# Patient Record
Sex: Female | Born: 1980 | Hispanic: No | Marital: Married | Smoking: Never smoker
Health system: Southern US, Community
[De-identification: ages and names within clinical notes are randomized; demographics above are authoritative.]

## PROBLEM LIST (undated history)

## (undated) DIAGNOSIS — N979 Female infertility, unspecified: Secondary | ICD-10-CM

## (undated) DIAGNOSIS — O24419 Gestational diabetes mellitus in pregnancy, unspecified control: Secondary | ICD-10-CM

## (undated) DIAGNOSIS — B019 Varicella without complication: Secondary | ICD-10-CM

## (undated) DIAGNOSIS — E079 Disorder of thyroid, unspecified: Secondary | ICD-10-CM

## (undated) DIAGNOSIS — Z789 Other specified health status: Secondary | ICD-10-CM

## (undated) DIAGNOSIS — K219 Gastro-esophageal reflux disease without esophagitis: Secondary | ICD-10-CM

## (undated) DIAGNOSIS — J329 Chronic sinusitis, unspecified: Secondary | ICD-10-CM

## (undated) HISTORY — DX: Gestational diabetes mellitus in pregnancy, unspecified control: O24.419

## (undated) HISTORY — DX: Gastro-esophageal reflux disease without esophagitis: K21.9

## (undated) HISTORY — PX: NO PAST SURGERIES: SHX2092

## (undated) HISTORY — DX: Chronic sinusitis, unspecified: J32.9

## (undated) HISTORY — PX: WISDOM TOOTH EXTRACTION: SHX21

## (undated) HISTORY — DX: Female infertility, unspecified: N97.9

## (undated) HISTORY — DX: Other specified health status: Z78.9

## (undated) HISTORY — DX: Varicella without complication: B01.9

## (undated) HISTORY — DX: Disorder of thyroid, unspecified: E07.9

---

## 2017-11-02 DIAGNOSIS — R51 Headache: Secondary | ICD-10-CM | POA: Diagnosis not present

## 2017-11-17 ENCOUNTER — Ambulatory Visit: Payer: BLUE CROSS/BLUE SHIELD | Admitting: Family Medicine

## 2017-11-20 ENCOUNTER — Encounter: Payer: Self-pay | Admitting: Family Medicine

## 2017-11-20 ENCOUNTER — Ambulatory Visit (INDEPENDENT_AMBULATORY_CARE_PROVIDER_SITE_OTHER): Payer: BLUE CROSS/BLUE SHIELD | Admitting: Family Medicine

## 2017-11-20 VITALS — BP 111/74 | HR 68 | Temp 98.0°F | Resp 20 | Ht 62.5 in | Wt 147.0 lb

## 2017-11-20 DIAGNOSIS — R7989 Other specified abnormal findings of blood chemistry: Secondary | ICD-10-CM | POA: Insufficient documentation

## 2017-11-20 DIAGNOSIS — Z79899 Other long term (current) drug therapy: Secondary | ICD-10-CM | POA: Insufficient documentation

## 2017-11-20 DIAGNOSIS — Z789 Other specified health status: Secondary | ICD-10-CM

## 2017-11-20 DIAGNOSIS — K2100 Gastro-esophageal reflux disease with esophagitis, without bleeding: Secondary | ICD-10-CM

## 2017-11-20 DIAGNOSIS — Z7689 Persons encountering health services in other specified circumstances: Secondary | ICD-10-CM

## 2017-11-20 DIAGNOSIS — L659 Nonscarring hair loss, unspecified: Secondary | ICD-10-CM | POA: Diagnosis not present

## 2017-11-20 MED ORDER — PANTOPRAZOLE SODIUM 40 MG PO TBEC: 40.0000 mg | DELAYED_RELEASE_TABLET | Freq: Every day | ORAL | 3 refills | Status: DC

## 2017-11-20 NOTE — Progress Notes (Signed)
Patient ID: Sonya Clayton, female  DOB: 05-Sep-1980, 37 y.o.   MRN: [id number] Patient Care Team    Relationship Specialty Notifications Start End  Natalia LeatherwoodKuneff, Divinity Kyler A, DO PCP - General Family Medicine  11/20/17     Chief Complaint  Patient presents with  . Establish Care  . Hypothyroidism    Subjective:  Sonya Clayton is a 37 y.o.  female present for new patient establishment. All past medical history, surgical history, allergies, family history, immunizations, medications and social history were updated in the electronic medical record today. All recent labs, ED visits and hospitalizations within the last year were reviewed.  Patient [place] to [place].  Abnormal TSH/hair loss/vegetarian diet Patient reports her thyroid has been supplemented off and on throughout the last few years of her life and pregnancies.  She has not been on supplements for a few years.  They have been monitoring her thyroid levels approximately every 6 months and her last level was borderline.  She states that was approximately 7 months ago.  Patient does complain of weight gain, dry skin and hair loss today.  Use of proton pump inhibitor therapy/Gastroesophageal reflux disease with esophagitis She reports longtime use of pantoprazole.  She states she has had an EGD in 2015 and in 2018.  She has been unable to discontinue use of PPI without reproduction of symptoms.  She states with the medication her symptoms are controlled with only occasional flares if she eats something spicy or oily.   Depression screen PHQ 2/9 11/20/2017  Decreased Interest 0  Down, Depressed, Hopeless 0  PHQ - 2 Score 0   No flowsheet data found.  Current Exercise Habits: The patient does not participate in regular exercise at present Exercise limited by: None identified Fall Risk  11/20/2017  Falls in the past year? No    There is no immunization history on file for this patient.  No exam  data present  Past Medical History:  Diagnosis Date  . Chicken pox   . GERD (gastroesophageal reflux disease)   . Gestational diabetes   . Thyroid disease   . Vegetarian diet    Allergies  Allergen Reactions  . Penicillins Hives   Past Surgical History:  Procedure Laterality Date  . NO PAST SURGERIES     Family History  Problem Relation Age of Onset  . Hypertension Mother   . Hypertension Father   . Breast cancer Paternal Grandmother   . Diabetes Paternal Grandmother   . Hypertension Paternal Grandmother   . Hypertension Paternal Grandfather    Social History   Socioeconomic History  . Marital status: Married    Spouse name: Not on file  . Number of children: Not on file  . Years of education: Not on file  . Highest education level: Not on file  Occupational History  . Not on file  Social Needs  . Financial resource strain: Not on file  . Food insecurity:    Worry: Not on file    Inability: Not on file  . Transportation needs:    Medical: Not on file    Non-medical: Not on file  Tobacco Use  . Smoking status: Never Smoker  . Smokeless tobacco: Never Used  Substance and Sexual Activity  . Alcohol use: Never    Frequency: Never  . Drug use: Never  . Sexual activity: Yes    Partners: Male  Lifestyle  . Physical activity:    Days  per week: Not on file    Minutes per session: Not on file  . Stress: Not on file  Relationships  . Social connections:    Talks on phone: Not on file    Gets together: Not on file    Attends religious service: Not on file    Active member of club or organization: Not on file    Attends meetings of clubs or organizations: Not on file    Relationship status: Not on file  . Intimate partner violence:    Fear of current or ex partner: Not on file    Emotionally abused: Not on file    Physically abused: Not on file    Forced sexual activity: Not on file  Other Topics Concern  . Not on file  Social History Narrative   Marital  status/children/pets: married, 2 children   Education/employment: B.S. Electrical eng.    Safety:      -Wears a bicycle helmet riding a bike: No     -smoke alarm in the home:Yes     - wears seatbelt: Yes     - Feels safe in their relationships: Yes   Allergies as of 11/20/2017      Reactions   Penicillins Hives      Medication List        Accurate as of 11/20/17  6:29 PM. Always use your most recent med list.          multivitamin capsule Take 1 capsule by mouth daily.   pantoprazole 40 MG tablet Commonly known as:  PROTONIX Take 1 tablet (40 mg total) by mouth daily.       All past medical history, surgical history, allergies, family history, immunizations andmedications were updated in the EMR today and reviewed under the history and medication portions of their EMR.     No results found for this or any previous visit (from the past 2160 hour(s)).  Patient was never admitted.   ROS: 14 pt review of systems performed and negative (unless mentioned in an HPI)  Objective: BP 111/74 (BP Location: Left Arm, Patient Position: Sitting, Cuff Size: Normal)   Pulse 68   Temp 98 F (36.7 C)   Resp 20   Ht 5' 2.5" (1.588 m)   Wt 147 lb (66.7 kg)   SpO2 97%   BMI 26.46 kg/m  Gen: Afebrile. No acute distress. Nontoxic in appearance, well-developed, well-nourished, very pleasant, overweight female from [place]. HENT: AT. [place].  MMM.  Scalp with diffuse thinning of hair, no patchy hair loss. Eyes:Pupils Equal Round Reactive to light, Extraocular movements intact,  Conjunctiva without redness, discharge or icterus. Neck/lymp/endocrine: Supple, no lymphadenopathy, no thyromegaly CV: RRR no murmur, no edema Chest: CTAB, no wheeze, rhonchi or crackles.  Abd: Soft.NTND. BS present.  Skin: No rashes, purpura or petechiae. Warm and well-perfused. Skin intact. Neuro/Msk:  Normal gait. PERLA. EOMi. Alert. Oriented x3.   Psych: Normal affect, dress and demeanor. Normal speech. Normal  thought content and judgment.  Assessment/plan: Zayley Arras is a 37 y.o. female present for new pt establishment.  Abnormal TSH - retest today. Can not complete more than TSH with her on high dose biotin.  Advised her to stop the biotin since it is not working for her, and we may need to further evaluate her thyroid in the future. - TSH  Hair loss/Vegetarian diet/Long term use of PPI -Patient with overall thinning hair of scalp.  Discussed options with her today on evaluation and agreed to  move forward with vitamin deficiency testing given she has a vegetarian diet and on long-term PPI. - Vitamin D (25 hydroxy) - B12 - Iron, TIBC and Ferritin Panel - CBC - TSH - If labs do not indicate cause, will refer to dermatology for further eval.   Use of proton pump inhibitor therapy/Gastroesophageal reflux disease with esophagitis -She has been on long-term use of Protonix with reports of 2 EGDs.  He has been unable to stop medication without reproduction of symptoms. -Refills provided for her today. - Magnesium - pantoprazole (PROTONIX) 40 MG tablet; Take 1 tablet (40 mg total) by mouth daily.  Dispense: 90 tablet; Refill: 3   Return if symptoms worsen or fail to improve.   Note is dictated utilizing voice recognition software. Although note has been proof read prior to signing, occasional typographical errors still can be missed. If any questions arise, please do not hesitate to call for verification.  Electronically signed by: Felix Pacini, DO [facility] [place]

## 2017-11-20 NOTE — Patient Instructions (Addendum)
We will test your blood today for vit d, b12, iron and thyroid.  You can stop the biotin if you want. If we need to run the whole panel we will need you to  not be on this medication.  I have sent refills of your Pantoprazole as well.    Please help us help you:  We are honored you have [facility]. Below you will find basic instructions that you may need to access in the future. Please help us help you by reading the instructions, which cover many of the frequent questions we experience.   Prescription refills and request:  -In order to allow more efficient response time, please call your pharmacy for all refills. They will forward the request electronically to us. This allows for the quickest possible response. Request left on a nurse line can take longer to refill, since these are checked as time allows between office patients and other phone calls.  - refill request can take up to 3-5 working days to complete.  - If request is sent electronically and request is appropiate, it is usually completed in 1-2 business days.  - all patients will need to be seen routinely for all chronic medical conditions requiring prescription medications (see follow-up below). If you are overdue for follow up on your condition, you will be asked to make an appointment and we will call in enough medication to cover you until your appointment (up to 30 days).  - all controlled substances will require a face to face visit to request/refill.  - if you desire your prescriptions to go through a new pharmacy, and have an active script at original pharmacy, you will need to call your pharmacy and have scripts transferred to new pharmacy. This is completed between the pharmacy locations and not by your provider.    Results: If any images or labs were ordered, it can take up to 1 week to get results depending on the test ordered and the lab/facility running and resulting the test. -  Normal or stable results, which do not need further discussion, may be released to your mychart immediately with attached note to you. A call may not be generated for normal results. Please make certain to sign up for mychart. If you have questions on how to activate your mychart you can call the front office.  - If your results need further discussion, our office will attempt to contact you via phone, and if unable to reach you after 2 attempts, we will release your abnormal result to your mychart with instructions.  - All results will be automatically released in mychart after 1 week.  - Your provider will provide you with explanation and instruction on all relevant material in your results. Please keep in mind, results and labs may appear confusing or abnormal to the untrained eye, but it does not mean they are actually abnormal for you personally. If you have any questions about your results that are not covered, or you desire more detailed explanation than what was provided, you should make an appointment with your provider to do so.   Our office handles many outgoing and incoming calls daily. If we have not contacted you within 1 week about your results, please check your mychart to see if there is a message first and if not, then contact our office.  In helping with this matter, you help decrease call volume, and therefore allow us to be able to respond  to patients needs more efficiently.   Acute office visits (sick visit):  An acute visit is intended for a new problem and are scheduled in shorter time slots to allow schedule openings for patients with new problems. This is the appropriate visit to discuss a new problem. Problems will not be addressed by phone call or Echart message. Appointment is needed if requesting treatment. In order to provide you with excellent quality medical care with proper time for you to explain your problem, have an exam and receive treatment with instructions, these  appointments should be limited to one new problem per visit. If you experience a new problem, in which you desire to be addressed, please make an acute office visit, we save openings on the schedule to accommodate you. Please do not save your new problem for any other type of visit, let us take care of it properly and quickly for you.   Follow up visits:  Depending on your condition(s) your provider will need to see you routinely in order to provide you with quality care and prescribe medication(s). Most chronic conditions (Example: hypertension, Diabetes, depression/anxiety... etc), require visits a couple times a year. Your provider will instruct you on proper follow up for your personal medical conditions and history. Please make certain to make follow up appointments for your condition as instructed. Failing to do so could result in lapse in your medication treatment/refills. If you request a refill, and are overdue to be seen on a condition, we will always provide you with a 30 day script (once) to allow you time to schedule.    Medicare wellness (well visit): - we have a wonderful Nurse Maudie Mercury), that will meet with you and provide you will yearly medicare wellness visits. These visits should occur yearly (can not be scheduled less than 1 calendar year apart) and cover preventive health, immunizations, advance directives and screenings you are entitled to yearly through your medicare benefits. Do not miss out on your entitled benefits, this is when medicare will pay for these benefits to be ordered for you.  These are strongly encouraged by your provider and is the appropriate type of visit to make certain you are up to date with all preventive health benefits. If you have not had your medicare wellness exam in the last 12 months, please make certain to schedule one by calling the office and schedule your medicare wellness with Maudie Mercury as soon as possible.   Yearly physical (well visit):  - Adults are  recommended to be seen yearly for physicals. Check with your insurance and date of your last physical, most insurances require one calendar year between physicals. Physicals include all preventive health topics, screenings, medical exam and labs that are appropriate for gender/age and history. You may have fasting labs needed at this visit. This is a well visit (not a sick visit), new problems should not be covered during this visit (see acute visit).  - Pediatric patients are seen more frequently when they are younger. Your provider will advise you on well child visit timing that is appropriate for your their age. - This is not a medicare wellness visit. Medicare wellness exams do not have an exam portion to the visit. Some medicare companies allow for a physical, some do not allow a yearly physical. If your medicare allows a yearly physical you can schedule the medicare wellness with our nurse Maudie Mercury and have your physical with your provider after, on the same day. Please check with insurance for your full  benefits.   Late Policy/No Shows:  - all new patients should arrive 15-30 minutes earlier than appointment to allow [place] time  to  obtain all personal demographics,  insurance information and for you to complete office paperwork. - All established patients should arrive 10-15 minutes earlier than appointment time to update all information and be checked in .  - In our best efforts to run on time, if you are late for your appointment you will be asked to either reschedule or if able, we will work you back into the schedule. There will be a wait time to work you back in the schedule,  depending on availability.  - If you are unable to make it to your appointment as scheduled, please call 24 hours ahead of time to allow [place] to fill the time slot with someone else who needs to be seen. If you do not cancel your appointment ahead of time, you may be charged a no show fee.

## 2017-11-21 LAB — IRON,TIBC AND FERRITIN PANEL
%SAT: 16 % (calc) (ref 16–45)
Ferritin: 58 ng/mL (ref 16–154)
IRON: 53 ug/dL (ref 40–190)
TIBC: 341 mcg/dL (calc) (ref 250–450)

## 2017-11-21 LAB — CBC
HEMATOCRIT: 38 % (ref 35.0–45.0)
HEMOGLOBIN: 13.1 g/dL (ref 11.7–15.5)
MCH: 28.6 pg (ref 27.0–33.0)
MCHC: 34.5 g/dL (ref 32.0–36.0)
MCV: 83 fL (ref 80.0–100.0)
MPV: 11.1 fL (ref 7.5–12.5)
Platelets: 282 10*3/uL (ref 140–400)
RBC: 4.58 10*6/uL (ref 3.80–5.10)
RDW: 13 % (ref 11.0–15.0)
WBC: 8.4 10*3/uL (ref 3.8–10.8)

## 2017-11-21 LAB — VITAMIN B12: VITAMIN B 12: 596 pg/mL (ref 200–1100)

## 2017-11-21 LAB — MAGNESIUM: Magnesium: 2.1 mg/dL (ref 1.5–2.5)

## 2017-11-21 LAB — VITAMIN D 25 HYDROXY (VIT D DEFICIENCY, FRACTURES): Vit D, 25-Hydroxy: 20 ng/mL — ABNORMAL LOW (ref 30–100)

## 2017-11-21 LAB — TSH: TSH: 2.99 mIU/L

## 2017-11-23 ENCOUNTER — Telehealth: Payer: Self-pay | Admitting: Family Medicine

## 2017-11-23 DIAGNOSIS — E559 Vitamin D deficiency, unspecified: Secondary | ICD-10-CM

## 2017-11-23 DIAGNOSIS — L659 Nonscarring hair loss, unspecified: Secondary | ICD-10-CM

## 2017-11-23 MED ORDER — VITAMIN D (ERGOCALCIFEROL) 1.25 MG (50000 UNIT) PO CAPS: 50000.0000 [IU] | ORAL_CAPSULE | ORAL | 0 refills | Status: DC

## 2017-11-23 NOTE — Telephone Encounter (Signed)
Please inform patient the following information: Her labs are all normal with the exception of her Vit D is low.  - I have called in once weekly prescribed dosing and she should start daily OTC 1000u with food as well.  - recheck her levels by provider appt in 3 months.  - Although he rvit d is low, I do not believe that is the sole reason for her hair loss. Since the rest of her labs are normal, I would suggest a referral to dermatology to further eval her  Hair loss. I have placed this for her today and they will call to schedule.

## 2017-11-23 NOTE — Telephone Encounter (Signed)
Patient notified and verbalized understanding. 

## 2017-12-03 ENCOUNTER — Telehealth: Payer: Self-pay | Admitting: Family Medicine

## 2017-12-03 NOTE — Telephone Encounter (Signed)
Copied from CRM [id number]. Topic: Quick Communication - See Telephone Encounter >> Dec 03, 2017  2:57 PM Burchel, Abbi R wrote: CRM for notification. See Telephone encounter for: 12/03/17.  Pt requesting a callback for clarification of her vitamin D supplement instructions. Please call pt to advise.   Pt: [phone]

## 2017-12-03 NOTE — Telephone Encounter (Signed)
I contacted patient and she said she wanted clarification because she accidentally was taking 10,000 units daily instead of 1000 units.  Patient stated she was having headaches.  I advised her to STOP the 10,000 units and wait until she was ready to take her next 50,000 dose of vitamin D and then start daily 1,000 again unless other wise instructed after I sent message to provider.  Please advise if you would like her to do different regimen since taking two days of 10,000 u of vitamin D.

## 2017-12-04 NOTE — Telephone Encounter (Signed)
Agree with advice.  - re-Start Vit D 50,000u ONCE weekly, which is the  prescribed medication--> follow the label instructions. Then can take the 1000u daily of OTC on other days of the week. The OTC will continue even after prescribed is completed.

## 2018-03-05 ENCOUNTER — Ambulatory Visit (INDEPENDENT_AMBULATORY_CARE_PROVIDER_SITE_OTHER): Payer: BLUE CROSS/BLUE SHIELD | Admitting: *Deleted

## 2018-03-05 DIAGNOSIS — Z23 Encounter for immunization: Secondary | ICD-10-CM

## 2018-03-15 ENCOUNTER — Encounter: Payer: Self-pay | Admitting: Family Medicine

## 2018-03-15 ENCOUNTER — Ambulatory Visit: Payer: BLUE CROSS/BLUE SHIELD | Admitting: Family Medicine

## 2018-03-15 DIAGNOSIS — Z0289 Encounter for other administrative examinations: Secondary | ICD-10-CM

## 2018-04-22 DIAGNOSIS — L858 Other specified epidermal thickening: Secondary | ICD-10-CM | POA: Diagnosis not present

## 2018-04-22 DIAGNOSIS — L658 Other specified nonscarring hair loss: Secondary | ICD-10-CM | POA: Diagnosis not present

## 2018-04-22 DIAGNOSIS — L918 Other hypertrophic disorders of the skin: Secondary | ICD-10-CM | POA: Diagnosis not present

## 2018-06-10 ENCOUNTER — Ambulatory Visit (INDEPENDENT_AMBULATORY_CARE_PROVIDER_SITE_OTHER): Payer: BLUE CROSS/BLUE SHIELD | Admitting: Family Medicine

## 2018-06-10 ENCOUNTER — Encounter: Payer: Self-pay | Admitting: Family Medicine

## 2018-06-10 VITALS — BP 116/81 | HR 80 | Temp 98.0°F | Resp 20 | Ht 62.5 in | Wt 141.4 lb

## 2018-06-10 DIAGNOSIS — J309 Allergic rhinitis, unspecified: Secondary | ICD-10-CM | POA: Diagnosis not present

## 2018-06-10 DIAGNOSIS — R05 Cough: Secondary | ICD-10-CM

## 2018-06-10 DIAGNOSIS — R059 Cough, unspecified: Secondary | ICD-10-CM

## 2018-06-10 DIAGNOSIS — K219 Gastro-esophageal reflux disease without esophagitis: Secondary | ICD-10-CM | POA: Diagnosis not present

## 2018-06-10 NOTE — Progress Notes (Signed)
OFFICE VISIT  06/10/2018   CC:  Chief Complaint  Patient presents with  . URI   HPI:    Patient is a 38 y.o.  female who presents for dry cough. Describes a distinct acute episode of dry cough 3 wks ago, scratchy throat--> No HA.  She was in a closed, packed meeting room at work when it started.  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  Yesterday evening had a little bit of chest tightness.  No SOB, no fever, no body aches.  No chest pain. Took cough drops.  No hx of wheezing/RAD/asthma in the past.  Kids attend daycare. Husband with viral URI.  Past Medical History:  Diagnosis Date  . Chicken pox   . GERD (gastroesophageal reflux disease)   . Gestational diabetes   . Thyroid disease   . Vegetarian diet     Past Surgical History:  Procedure Laterality Date  . NO PAST SURGERIES      Outpatient Medications Prior to Visit  Medication Sig Dispense Refill  . Multiple Vitamin (MULTIVITAMIN) capsule Take 1 capsule by mouth daily.    . pantoprazole (PROTONIX) 40 MG tablet Take 1 tablet (40 mg total) by mouth daily. 90 tablet 3  . Vitamin D, Ergocalciferol, (DRISDOL) 50000 units CAPS capsule Take 1 capsule (50,000 Units total) by mouth every 7 (seven) days. (Patient not taking: Reported on 06/10/2018) 12 capsule 0   No facility-administered medications prior to visit.     Allergies  Allergen Reactions  . Penicillins Hives    ROS As per HPI  PE: Blood pressure 116/81, pulse 80, temperature 98 F (36.7 C), temperature source Oral, resp. rate 20, height 5' 2.5" (1.588 m), weight 141 lb 6.4 oz (64.1 kg), SpO2 98 %. Gen: Alert, well appearing.  Patient is oriented to person, place, time, and situation. AFFECT: pleasant, lucid thought and speech. ENT: Ears: EACs clear, normal epithelium.  TMs with good light reflex and landmarks bilaterally.  Eyes: no injection, icteris, swelling, or exudate.   EOMI, PERRLA. Nose: no drainage or turbinate edema/swelling.  No injection or focal lesion.  Mouth: lips without lesion/swelling.  Oral mucosa pink and moist.  Dentition intact and without obvious caries or gingival swelling.  Oropharynx without erythema, exudate, or swelling.  Neck - No masses or thyromegaly or limitation in range of motion CV: RRR, no m/r/g.   LUNGS: CTA bilat, nonlabored resps, good aeration in all lung fields. EXT: no clubbing or cyanosis.  no edema.    LABS:  Lab Results  Component Value Date   TSH 2.99 11/20/2017     Chemistry   No results found for: NA, K, CL, CO2, BUN, CREATININE, GLU No results found for: CALCIUM, ALKPHOS, AST, ALT, BILITOT    IMPRESSION AND PLAN:  Suspect either allergic rhinitis with PND cough vs LPR-induced cough. She is taking daily PPI and eating a GER-friendly diet for the most part. I recommended she try adding a daily nonsedating anihistamine for a week and she how she responds. No sign of viral or bacterial URI or bronchitis.  An After Visit Summary was printed and given to the patient.  FOLLOW UP: No follow-ups on file.  Signed:  Santiago Bumpers, MD           06/10/2018

## 2018-08-05 ENCOUNTER — Telehealth: Payer: Self-pay | Admitting: Family Medicine

## 2018-08-05 NOTE — Telephone Encounter (Signed)
Notified patient she is due for CPE. Patient will CB to schedule an appointment.

## 2018-11-29 ENCOUNTER — Telehealth: Payer: Self-pay

## 2018-11-29 DIAGNOSIS — K2100 Gastro-esophageal reflux disease with esophagitis, without bleeding: Secondary | ICD-10-CM

## 2018-11-29 DIAGNOSIS — Z79899 Other long term (current) drug therapy: Secondary | ICD-10-CM

## 2018-11-29 MED ORDER — PANTOPRAZOLE SODIUM 40 MG PO TBEC: 40.0000 mg | DELAYED_RELEASE_TABLET | Freq: Every day | ORAL | 0 refills | Status: DC

## 2018-11-29 NOTE — Telephone Encounter (Signed)
Pt Called and left VM asking for appt and refill on GERD medication. Pt was scheduled for virtual visit/[facility].  Pt asked for virtual visit and states she can come if labs are needed. Pt was sent 2 week supply of medication to last until appt.

## 2018-12-08 ENCOUNTER — Other Ambulatory Visit: Payer: Self-pay

## 2018-12-08 ENCOUNTER — Ambulatory Visit (INDEPENDENT_AMBULATORY_CARE_PROVIDER_SITE_OTHER): Payer: BC Managed Care – PPO | Admitting: Family Medicine

## 2018-12-08 ENCOUNTER — Encounter: Payer: Self-pay | Admitting: Family Medicine

## 2018-12-08 VITALS — Ht 62.5 in | Wt 137.4 lb

## 2018-12-08 DIAGNOSIS — K2100 Gastro-esophageal reflux disease with esophagitis, without bleeding: Secondary | ICD-10-CM

## 2018-12-08 DIAGNOSIS — E559 Vitamin D deficiency, unspecified: Secondary | ICD-10-CM | POA: Diagnosis not present

## 2018-12-08 DIAGNOSIS — R7989 Other specified abnormal findings of blood chemistry: Secondary | ICD-10-CM

## 2018-12-08 DIAGNOSIS — Z79899 Other long term (current) drug therapy: Secondary | ICD-10-CM

## 2018-12-08 MED ORDER — PANTOPRAZOLE SODIUM 40 MG PO TBEC: 40.0000 mg | DELAYED_RELEASE_TABLET | Freq: Every day | ORAL | 3 refills | Status: DC

## 2018-12-08 NOTE — Addendum Note (Signed)
Addended by: Ralph Dowdy on: 12/08/2018 11:25 AM   Modules accepted: Orders

## 2018-12-08 NOTE — Progress Notes (Signed)
VIRTUAL VISIT VIA VIDEO  I connected with Sonya Clayton on 12/08/18 at 11:00 AM EDT by a video enabled telemedicine application and verified that I am speaking with the correct person using two identifiers. Location patient: Home Location provider: [facility] Persons participating in the virtual visit: Patient, Dr. Claiborne BillingsKuneff and R.Baker, LPN  I discussed the limitations of evaluation and management by telemedicine and the availability of in person appointments. The patient expressed understanding and agreed to proceed.   SUBJECTIVE Chief Complaint  Patient presents with  . Gastroesophageal Reflux    Needs refill on protonix.     HPI: Sonya Clayton is a 38 y.o. female present for GERD follow up Use of proton pump inhibitor therapy/Gastroesophageal reflux disease with esophagitis Patient reports her symptoms are well controlled on Protonix 40 mg daily.  She does need refills on these today. Prior note: She reports longtime use of pantoprazole.  She states she has had an EGD in 2015 and in 2018.  She has been unable to discontinue use of PPI without reproduction of symptoms.  She states with the medication her symptoms are controlled with only occasional flares if she eats something spicy or oily.  Vit d def: Patient had a vitamin D level of 20 last year when collected.  She states she did take the prescribed weekly high dose of vitamin D and felt it did help with her hair and fatigue.  She did not start an over-the-counter supplementation after she completed a 3 months course of prescribed vitamin D.  She does take a multivitamin daily.  She did not return for lab check on her vitamin D.  Abnormal TSH Last TSH was in normal range.  She has stopped the biotin in March 2020.  And she would like retesting today. Prior note: Patient reports her thyroid has been supplemented off and on throughout the last few years of her life and pregnancies.  She has not been on  supplements for a few years.  They have been monitoring her thyroid levels approximately every 6 months and her last level was borderline.  She states that was approximately 7 months ago.  Patient does complain of weight gain, dry skin and hair loss today.  ROS: See pertinent positives and negatives per HPI.  Patient Active Problem List   Diagnosis Date Noted  . Abnormal TSH 11/20/2017  . Hair loss 11/20/2017  . Vegetarian diet 11/20/2017  . Use of proton pump inhibitor therapy 11/20/2017  . Gastroesophageal reflux disease with esophagitis 11/20/2017    Social History   Tobacco Use  . Smoking status: Never Smoker  . Smokeless tobacco: Never Used  Substance Use Topics  . Alcohol use: Never    Frequency: Never    Current Outpatient Medications:  Marland Kitchen.  Multiple Vitamin (MULTIVITAMIN) capsule, Take 1 capsule by mouth daily., Disp: , Rfl:  .  pantoprazole (PROTONIX) 40 MG tablet, Take 1 tablet (40 mg total) by mouth daily., Disp: 14 tablet, Rfl: 0  Allergies  Allergen Reactions  . Penicillins Hives    OBJECTIVE: Ht 5' 2.5" (1.588 m)   Wt 137 lb 7 oz (62.3 kg)   LMP 11/17/2018   BMI 24.74 kg/m  Gen: No acute distress. Nontoxic in appearance.  HENT: AT. [place].  MMM.  Eyes:Pupils Equal Round Reactive to light, Extraocular movements intact,  Conjunctiva without redness, discharge or icterus. Chest: Cough or shortness of breath not present Neuro: Alert. Oriented x3  Psych: Normal affect, dress and demeanor. Normal  speech. Normal thought content and judgment.  ASSESSMENT AND PLAN: Sonya Clayton is a 38 y.o. female present for  Use of proton pump inhibitor therapy/Gastroesophageal reflux disease with esophagitis -Stable. She has been on long-term use of Protonix with reports of 2 EGDs.  SHe has been unable to stop medication without reproduction of symptoms. - continue protonix 40 qd- refills provided.  - vitd, b12 and mag levels q 3 years >> DUE 2022 - pantoprazole (PROTONIX) 40 MG  tablet; Take 1 tablet (40 mg total) by mouth daily.  Dispense: 90 tablet; Refill: 3  Vit d def: Will recheck levels by lab appt. Discussed with her pigmented skin, vegetarian diet and chronic PPI use she may need to continue an OTC vit d daily to keep levels in normal range.   Abnormal TSH -She has stopped the biotin since March 2020.  TSH, TPO and T4 ordered for a lab collection this week.   Orders Placed This Encounter  Procedures  . TSH    Standing Status:   Future    Standing Expiration Date:   12/08/2019  . T4, free    Standing Status:   Future    Standing Expiration Date:   12/08/2019  . Vitamin D (25 hydroxy)    Standing Status:   Future    Standing Expiration Date:   12/08/2019  . Thyroid peroxidase antibody   > 25 minutes spent with patient, >50% of time spent face to face   CPE to be completed in September.  Howard Pouch, DO 12/08/2018

## 2018-12-15 ENCOUNTER — Ambulatory Visit (INDEPENDENT_AMBULATORY_CARE_PROVIDER_SITE_OTHER): Payer: BC Managed Care – PPO

## 2018-12-15 ENCOUNTER — Other Ambulatory Visit: Payer: Self-pay

## 2018-12-15 DIAGNOSIS — R7989 Other specified abnormal findings of blood chemistry: Secondary | ICD-10-CM

## 2018-12-15 DIAGNOSIS — Z23 Encounter for immunization: Secondary | ICD-10-CM

## 2018-12-15 DIAGNOSIS — E559 Vitamin D deficiency, unspecified: Secondary | ICD-10-CM | POA: Diagnosis not present

## 2018-12-15 DIAGNOSIS — R946 Abnormal results of thyroid function studies: Secondary | ICD-10-CM | POA: Diagnosis not present

## 2018-12-15 LAB — T4, FREE: Free T4: 0.87 ng/dL (ref 0.60–1.60)

## 2018-12-15 LAB — TSH: TSH: 2.44 u[IU]/mL (ref 0.35–4.50)

## 2018-12-15 LAB — VITAMIN D 25 HYDROXY (VIT D DEFICIENCY, FRACTURES): VITD: 27.12 ng/mL — ABNORMAL LOW (ref 30.00–100.00)

## 2018-12-16 LAB — THYROID PEROXIDASE ANTIBODY: Thyroperoxidase Ab SerPl-aCnc: 2 IU/mL (ref <9)

## 2019-01-07 ENCOUNTER — Encounter: Payer: BC Managed Care – PPO | Admitting: Family Medicine

## 2019-01-31 ENCOUNTER — Other Ambulatory Visit: Payer: Self-pay

## 2019-01-31 ENCOUNTER — Encounter: Payer: Self-pay | Admitting: Family Medicine

## 2019-01-31 ENCOUNTER — Ambulatory Visit (INDEPENDENT_AMBULATORY_CARE_PROVIDER_SITE_OTHER): Payer: BC Managed Care – PPO | Admitting: Family Medicine

## 2019-01-31 ENCOUNTER — Other Ambulatory Visit (HOSPITAL_COMMUNITY)
Admission: RE | Admit: 2019-01-31 | Discharge: 2019-01-31 | Disposition: A | Discharge status: Home / Self Care | Payer: BC Managed Care – PPO | Source: Ambulatory Visit | Attending: Family Medicine | Admitting: Family Medicine

## 2019-01-31 VITALS — BP 112/78 | HR 71 | Temp 98.7°F | Resp 17 | Ht 62.5 in | Wt 140.0 lb

## 2019-01-31 DIAGNOSIS — Z01419 Encounter for gynecological examination (general) (routine) without abnormal findings: Secondary | ICD-10-CM | POA: Insufficient documentation

## 2019-01-31 DIAGNOSIS — E559 Vitamin D deficiency, unspecified: Secondary | ICD-10-CM

## 2019-01-31 DIAGNOSIS — Z30431 Encounter for routine checking of intrauterine contraceptive device: Secondary | ICD-10-CM

## 2019-01-31 DIAGNOSIS — Z0000 Encounter for general adult medical examination without abnormal findings: Secondary | ICD-10-CM | POA: Diagnosis not present

## 2019-01-31 DIAGNOSIS — Z131 Encounter for screening for diabetes mellitus: Secondary | ICD-10-CM

## 2019-01-31 DIAGNOSIS — Z13220 Encounter for screening for lipoid disorders: Secondary | ICD-10-CM | POA: Diagnosis not present

## 2019-01-31 DIAGNOSIS — Z79899 Other long term (current) drug therapy: Secondary | ICD-10-CM

## 2019-01-31 DIAGNOSIS — Z130 Encounter for screening for diseases of the blood and blood-forming organs and certain disorders involving the immune mechanism: Secondary | ICD-10-CM | POA: Diagnosis not present

## 2019-01-31 NOTE — Progress Notes (Signed)
Patient ID: Sonya Clayton, female  DOB: 05-01-1980, 38 y.o.   MRN: [id number] Patient Care Team    Relationship Specialty Notifications Start End  Natalia Leatherwood, DO PCP - General Family Medicine  11/20/17     Chief Complaint  Patient presents with  . Annual Exam    Not fasting. Had tooth pulled 01/12/2019 and they did find cyst under tooth.     Subjective:  Sonya Clayton is a 38 y.o.  Female  present for CPE . All past medical history, surgical history, allergies, family history, immunizations, medications and social history were updated in the electronic medical record today. All recent labs, ED visits and hospitalizations within the last year were reviewed.  Health maintenance:  Colonoscopy: no fhx. Routine screen at 49 Mammogram: PGM with breast cancer. Routine screen at 40 recommended.  Cervical cancer screening: last pap: 2016 per pt. No prior abnormal. Uncertain if co-test preformed. She has a IUD that will need removed next year. No LMP recorded. (Menstrual status: IUD). Immunizations: tdap 2016 UTD, Influenza UTD 2020 Infectious disease screening: HIV completed 2016 DEXA:routine screen.  Assistive device: none Oxygen KAJ:GOTL Patient has a Dental home. Hospitalizations/ED visits: reviewed  Depression screen [facility] 2/9 01/31/2019 11/20/2017  Decreased Interest 0 0  Down, Depressed, Hopeless 0 0  PHQ - 2 Score 0 0   No flowsheet data found.   Immunization History  Administered Date(s) Administered  . Influenza,inj,Quad PF,6+ Mos 03/05/2018, 12/15/2018    Past Medical History:  Diagnosis Date  . Chicken pox   . GERD (gastroesophageal reflux disease)   . Gestational diabetes   . Thyroid disease   . Vegetarian diet    Allergies  Allergen Reactions  . Penicillins Hives   Past Surgical History:  Procedure Laterality Date  . NO PAST SURGERIES     Family History  Problem Relation Age of Onset  . Hypertension Mother   . Hypertension Father   . Breast  cancer Paternal Grandmother   . Diabetes Paternal Grandmother   . Hypertension Paternal Grandmother   . Hypertension Paternal Grandfather    Social History   Social History Narrative   Marital status/children/pets: married, 2 children   Education/employment: B.S. Electrical eng.    Safety:      -Wears a bicycle helmet riding a bike: No     -smoke alarm in the home:Yes     - wears seatbelt: Yes     - Feels safe in their relationships: Yes    Allergies as of 01/31/2019      Reactions   Penicillins Hives      Medication List       Accurate as of January 31, 2019  3:09 PM. If you have any questions, ask your nurse or doctor.        Biotin 57262 MCG Tabs Take 1 tablet by mouth daily.   levonorgestrel 20 MCG/24HR IUD Commonly known as: MIRENA 1 each by Intrauterine route once.   multivitamin capsule Take 1 capsule by mouth daily.   pantoprazole 40 MG tablet Commonly known as: PROTONIX Take 1 tablet (40 mg total) by mouth daily.   Vitamin D3 20 MCG (800 UNIT) Tabs Take 1 tablet by mouth daily.       All past medical history, surgical history, allergies, family history, immunizations andmedications were updated in the EMR today and reviewed under the history and medication portions of their EMR.     Recent Results (from the past 2160 hour(s))  Thyroid  peroxidase antibody     Status: None   Collection Time: 12/15/18  9:49 AM  Result Value Ref Range   Thyroperoxidase Ab SerPl-aCnc 2 <9 IU/mL  Vitamin D (25 hydroxy)     Status: Abnormal   Collection Time: 12/15/18  9:49 AM  Result Value Ref Range   VITD 27.12 (L) 30.00 - 100.00 ng/mL  T4, free     Status: None   Collection Time: 12/15/18  9:49 AM  Result Value Ref Range   Free T4 0.87 0.60 - 1.60 ng/dL    Comment: Specimens from patients who are undergoing biotin therapy and /or ingesting biotin supplements may contain high levels of biotin.  The higher biotin concentration in these specimens interferes with this  Free T4 assay.  Specimens that contain high levels  of biotin may cause false high results for this Free T4 assay.  Please interpret results in light of the total clinical presentation of the patient.    TSH     Status: None   Collection Time: 12/15/18  9:49 AM  Result Value Ref Range   TSH 2.44 0.35 - 4.50 uIU/mL    Patient was never admitted.   ROS: 14 pt review of systems performed and negative (unless mentioned in an HPI)  Objective: BP 112/78 (BP Location: Left Arm, Patient Position: Sitting, Cuff Size: Normal)   Pulse 71   Temp 98.7 F (37.1 C) (Temporal)   Resp 17   Ht 5' 2.5" (1.588 m)   Wt 140 lb (63.5 kg)   SpO2 98%   BMI 25.20 kg/m  Gen: Afebrile. No acute distress. Nontoxic in appearance, well-developed, well-nourished,  Pleasant female.  HENT: AT. [place]. Bilateral TM visualized and normal in appearance, normal external auditory canal. MMM, no oral lesions, adequate dentition. Bilateral nares within normal limits. Throat without erythema, ulcerations or exudates. no Cough on exam, no hoarseness on exam. Eyes:Pupils Equal Round Reactive to light, Extraocular movements intact,  Conjunctiva without redness, discharge or icterus. Neck/lymp/endocrine: Supple,no lymphadenopathy, no thyromegaly CV: RRR no murmur, no edema, +2/4 P posterior tibialis pulses. no carotid bruits. No JVD. Chest: CTAB, no wheeze, rhonchi or crackles. normal Respiratory effort. good Air movement. Abd: Soft. flat. NTND. BS present. no Masses palpated. No hepatosplenomegaly. No rebound tenderness or guarding. Skin: no rashes, purpura or petechiae. Warm and well-perfused. Skin intact. Neuro/Msk:  Normal gait. PERLA. EOMi. Alert. Oriented x3.  Cranial nerves II through XII intact. Muscle strength 5/5 upper/lower extremity. DTRs equal bilaterally. Psych: Normal affect, dress and demeanor. Normal speech. Normal thought content and judgment. Breasts: breasts appear normal, symmetrical, no tenderness on exam, no  suspicious masses, no skin or nipple changes or axillary nodes. GYN:  External genitalia within normal limits, normal hair distribution, no lesions. Urethral meatus normal, no lesions. Vaginal mucosa pink, moist, normal rugae, no lesions. No cystocele or rectocele. cervix without lesions, no discharge. IUD strings identified.  Bimanual exam revealed normal uterus.  No bladder/suprapubic fullness, masses or tenderness. No cervical motion tenderness. No adnexal fullness. Anus and perineum within normal limits, no lesions.  No exam data present  Assessment/plan: Rondell ReamsGauri Agnihopri is a 38 y.o. female present for CPE  Encounter for routine gynecological examination with Papanicolaou smear of cervix - Cytology - PAP( [place]) with co-test - will need referral to GYN to remove IUD and discuss replacement in 2021- will refer today (female provider requested) Lipid screening - Lipid panel Diabetes mellitus screening - Hemoglobin A1c Screening for deficiency anemia - CBC Encounter for  long-term current use of medication - Comprehensive metabolic panel Vitamin D deficiency - taking 800u vit d daily (missed some days after surgery) - Cholecalciferol (VITAMIN D3) 20 MCG (800 UNIT) TABS; Take 1 tablet by mouth daily. - Vitamin D (25 hydroxy) Encounter for preventative adult health care examination Patient was encouraged to exercise greater than 150 minutes a week. Patient was encouraged to choose a diet filled with fresh fruits and vegetables, and lean meats. AVS provided to patient today for education/recommendation on gender specific health and safety maintenance. Colonoscopy: no fhx. Routine screen at 49 Mammogram: PGM with breast cancer. Routine screen at 40 recommended.  Cervical cancer screening: last pap: 2016 per pt. No prior abnormal. Uncertain if co-test preformed. She has a IUD that will need removed next year. No LMP recorded. (Menstrual status: IUD). Immunizations: tdap 2016 UTD,  Influenza UTD 2020 Infectious disease screening: HIV completed 2016    Return in about 1 year (around 01/31/2020) for CPE (30 min).   Orders Placed This Encounter  Procedures  . CBC  . Comprehensive metabolic panel  . Hemoglobin A1c  . Lipid panel  . Vitamin D (25 hydroxy)  . Ambulatory referral to Gynecology     Electronically signed by: Howard Pouch, DO [place]

## 2019-01-31 NOTE — Patient Instructions (Signed)
Health Maintenance, Female Adopting a healthy lifestyle and getting preventive care are important in promoting health and wellness. Ask your health care provider about:  The right schedule for you to have regular tests and exams.  Things you can do on your own to prevent diseases and keep yourself healthy. What should I know about diet, weight, and exercise? Eat a healthy diet   Eat a diet that includes plenty of vegetables, fruits, low-fat dairy products, and lean protein.  Do not eat a lot of foods that are high in solid fats, added sugars, or sodium. Maintain a healthy weight Body mass index (BMI) is used to identify weight problems. It estimates body fat based on height and weight. Your health care provider can help determine your BMI and help you achieve or maintain a healthy weight. Get regular exercise Get regular exercise. This is one of the most important things you can do for your health. Most adults should:  Exercise for at least 150 minutes each week. The exercise should increase your heart rate and make you sweat (moderate-intensity exercise).  Do strengthening exercises at least twice a week. This is in addition to the moderate-intensity exercise.  Spend less time sitting. Even light physical activity can be beneficial. Watch cholesterol and blood lipids Have your blood tested for lipids and cholesterol at 39 years of age, then have this test every 5 years. Have your cholesterol levels checked more often if:  Your lipid or cholesterol levels are high.  You are older than 40 years of age.  You are at high risk for heart disease. What should I know about cancer screening? Depending on your health history and family history, you may need to have cancer screening at various ages. This may include screening for:  Breast cancer.  Cervical cancer.  Colorectal cancer.  Skin cancer.  Lung cancer. What should I know about heart disease, diabetes, and high blood  pressure? Blood pressure and heart disease  High blood pressure causes heart disease and increases the risk of stroke. This is more likely to develop in people who have high blood pressure readings, are of African descent, or are overweight.  Have your blood pressure checked: ? Every 3-5 years if you are 18-39 years of age. ? Every year if you are 40 years old or older. Diabetes Have regular diabetes screenings. This checks your fasting blood sugar level. Have the screening done:  Once every three years after age 40 if you are at a normal weight and have a low risk for diabetes.  More often and at a younger age if you are overweight or have a high risk for diabetes. What should I know about preventing infection? Hepatitis B If you have a higher risk for hepatitis B, you should be screened for this virus. Talk with your health care provider to find out if you are at risk for hepatitis B infection. Hepatitis C Testing is recommended for:  Everyone born from 1945 through 1965.  Anyone with known risk factors for hepatitis C. Sexually transmitted infections (STIs)  Get screened for STIs, including gonorrhea and chlamydia, if: ? You are sexually active and are younger than 39 years of age. ? You are older than 39 years of age and your health care provider tells you that you are at risk for this type of infection. ? Your sexual activity has changed since you were last screened, and you are at increased risk for chlamydia or gonorrhea. Ask your health care provider if   you are at risk.  Ask your health care provider about whether you are at high risk for HIV. Your health care provider may recommend a prescription medicine to help prevent HIV infection. If you choose to take medicine to prevent HIV, you should first get tested for HIV. You should then be tested every 3 months for as long as you are taking the medicine. Pregnancy  If you are about to stop having your period (premenopausal) and  you may become pregnant, seek counseling before you get pregnant.  Take 400 to 800 micrograms (mcg) of folic acid every day if you become pregnant.  Ask for birth control (contraception) if you want to prevent pregnancy. Osteoporosis and menopause Osteoporosis is a disease in which the bones lose minerals and strength with aging. This can result in bone fractures. If you are 65 years old or older, or if you are at risk for osteoporosis and fractures, ask your health care provider if you should:  Be screened for bone loss.  Take a calcium or vitamin D supplement to lower your risk of fractures.  Be given hormone replacement therapy (HRT) to treat symptoms of menopause. Follow these instructions at home: Lifestyle  Do not use any products that contain nicotine or tobacco, such as cigarettes, e-cigarettes, and chewing tobacco. If you need help quitting, ask your health care provider.  Do not use street drugs.  Do not share needles.  Ask your health care provider for help if you need support or information about quitting drugs. Alcohol use  Do not drink alcohol if: ? Your health care provider tells you not to drink. ? You are pregnant, may be pregnant, or are planning to become pregnant.  If you drink alcohol: ? Limit how much you use to 0-1 drink a day. ? Limit intake if you are breastfeeding.  Be aware of how much alcohol is in your drink. In the [place] one drink equals one 12 oz bottle of beer (355 mL), one 5 oz glass of wine (148 mL), or one 1 oz glass of hard liquor (44 mL). General instructions  Schedule regular health, dental, and eye exams.  Stay current with your vaccines.  Tell your health care provider if: ? You often feel depressed. ? You have ever been abused or do not feel safe at home. Summary  Adopting a healthy lifestyle and getting preventive care are important in promoting health and wellness.  Follow your health care provider's instructions about healthy  diet, exercising, and getting tested or screened for diseases.  Follow your health care provider's instructions on monitoring your cholesterol and blood pressure. This information is not intended to replace advice given to you by your health care provider. Make sure you discuss any questions you have with your health care provider. Document Released: 10/14/2010 Document Revised: 03/24/2018 Document Reviewed: 03/24/2018 Elsevier Patient Education  2020 Elsevier Inc.  

## 2019-02-01 LAB — CBC
HCT: 37 % (ref 36.0–46.0)
Hemoglobin: 12.8 g/dL (ref 12.0–15.0)
MCHC: 34.5 g/dL (ref 30.0–36.0)
MCV: 85.9 fl (ref 78.0–100.0)
Platelets: 258 10*3/uL (ref 150.0–400.0)
RBC: 4.3 Mil/uL (ref 3.87–5.11)
RDW: 12.6 % (ref 11.5–15.5)
WBC: 9.3 10*3/uL (ref 4.0–10.5)

## 2019-02-01 LAB — COMPREHENSIVE METABOLIC PANEL
ALT: 17 U/L (ref 0–35)
AST: 15 U/L (ref 0–37)
Albumin: 4.6 g/dL (ref 3.5–5.2)
Alkaline Phosphatase: 47 U/L (ref 39–117)
BUN: 8 mg/dL (ref 6–23)
CO2: 25 mEq/L (ref 19–32)
Calcium: 9.5 mg/dL (ref 8.4–10.5)
Chloride: 106 mEq/L (ref 96–112)
Creatinine, Ser: 0.64 mg/dL (ref 0.40–1.20)
GFR: 103.26 mL/min (ref >60.00)
Glucose, Bld: 104 mg/dL — ABNORMAL HIGH (ref 70–99)
Potassium: 3.9 mEq/L (ref 3.5–5.1)
Sodium: 137 mEq/L (ref 135–145)
Total Bilirubin: 0.4 mg/dL (ref 0.2–1.2)
Total Protein: 7.2 g/dL (ref 6.0–8.3)

## 2019-02-01 LAB — LIPID PANEL
Cholesterol: 200 mg/dL (ref 0–200)
HDL: 38.3 mg/dL — ABNORMAL LOW (ref >39.00)
NonHDL: 161.65
Total CHOL/HDL Ratio: 5
Triglycerides: 242 mg/dL — ABNORMAL HIGH (ref 0.0–149.0)
VLDL: 48.4 mg/dL — ABNORMAL HIGH (ref 0.0–40.0)

## 2019-02-01 LAB — VITAMIN D 25 HYDROXY (VIT D DEFICIENCY, FRACTURES): Vit D, 25-Hydroxy: 25 ng/mL — ABNORMAL LOW (ref 30–100)

## 2019-02-01 LAB — LDL CHOLESTEROL, DIRECT: Direct LDL: 131 mg/dL

## 2019-02-01 LAB — HEMOGLOBIN A1C: Hgb A1c MFr Bld: 5.3 % (ref 4.6–6.5)

## 2019-02-03 ENCOUNTER — Telehealth: Payer: Self-pay | Admitting: Family Medicine

## 2019-02-03 NOTE — Telephone Encounter (Signed)
Please inform patient the following information: I am still waiting on her PAP results- these can take about a week sometimes to return.  We will call her once with they are received. - her blood work:    -She has normal function of her liver, kidney and her electrolytes are normal.    -Her diabetes screen/A1c is also normal.    -Her vitamin D is still low at 25.  Would encourage her to increase her vitamin D to 1000-2000 units daily with food.    -Her cholesterol overall was good and in normal range with the exception of mildly elevated triglycerides>> however she was not fasting so it was expected these will be a little higher than normal, no concerns.

## 2019-02-03 NOTE — Telephone Encounter (Signed)
Pt was called and VM was left to return call  °

## 2019-02-04 NOTE — Telephone Encounter (Signed)
Pt was called and given lab results/instructions, she verbalized understanding  

## 2019-02-08 LAB — CYTOLOGY - PAP
Comment: NEGATIVE
Diagnosis: NEGATIVE
High risk HPV: NEGATIVE

## 2019-03-31 ENCOUNTER — Ambulatory Visit: Payer: BC Managed Care – PPO | Admitting: Obstetrics and Gynecology

## 2019-05-12 ENCOUNTER — Telehealth: Payer: Self-pay | Admitting: Obstetrics and Gynecology

## 2019-05-12 ENCOUNTER — Ambulatory Visit: Payer: BC Managed Care – PPO | Admitting: Obstetrics and Gynecology

## 2019-05-12 ENCOUNTER — Encounter: Payer: Self-pay | Admitting: Obstetrics and Gynecology

## 2019-05-12 NOTE — Telephone Encounter (Signed)
Patient arrived late for new patient appointment. Rescheduled to 06/29/19 per patient.

## 2019-06-29 ENCOUNTER — Other Ambulatory Visit: Payer: Self-pay

## 2019-06-29 ENCOUNTER — Encounter: Payer: Self-pay | Admitting: Obstetrics and Gynecology

## 2019-06-29 ENCOUNTER — Ambulatory Visit (INDEPENDENT_AMBULATORY_CARE_PROVIDER_SITE_OTHER): Payer: BC Managed Care – PPO | Admitting: Obstetrics and Gynecology

## 2019-06-29 VITALS — BP 100/62 | HR 86 | Temp 98.4°F | Ht 62.0 in | Wt 141.0 lb

## 2019-06-29 DIAGNOSIS — Z30432 Encounter for removal of intrauterine contraceptive device: Secondary | ICD-10-CM | POA: Diagnosis not present

## 2019-06-29 DIAGNOSIS — Z3009 Encounter for other general counseling and advice on contraception: Secondary | ICD-10-CM | POA: Diagnosis not present

## 2019-06-29 NOTE — Progress Notes (Addendum)
39 y.o. [id number] Married Other or two or more races Not Hispanic or Latino female here for mirena IUD removal.   Period Cycle (Days): 29 Period Duration (Days): 8-9 Period Pattern: Regular Menstrual Flow: Light Menstrual Control: Panty liner Menstrual Control Change Freq (Hours): 12 Dysmenorrhea: None  Patient's last menstrual period was 06/20/2019.          Sexually active: Yes.    The current method of family planning is IUD.    Smoker:  no  Health Maintenance: Pap:  Normal neg HR HPV 01/31/19 History of abnormal Pap:  no TDaP: 2016 Gardasil: unsure    reports that she has never smoked. She has never used smokeless tobacco. She reports that she does not drink alcohol or use drugs.  Past Medical History:  Diagnosis Date  . Chicken pox   . GERD (gastroesophageal reflux disease)   . Gestational diabetes   . Infertility, female   . Thyroid disease   . Vegetarian diet     Past Surgical History:  Procedure Laterality Date  . NO PAST SURGERIES    . WISDOM TOOTH EXTRACTION      Current Outpatient Medications  Medication Sig Dispense Refill  . Biotin 35573 MCG TABS Take 1 tablet by mouth daily.    . Cholecalciferol (VITAMIN D3) 20 MCG (800 UNIT) TABS Take 1 tablet by mouth daily.    Marland Kitchen levonorgestrel (MIRENA) 20 MCG/24HR IUD 1 each by Intrauterine route once.    . Multiple Vitamin (MULTIVITAMIN) capsule Take 1 capsule by mouth daily.    . pantoprazole (PROTONIX) 40 MG tablet Take 1 tablet (40 mg total) by mouth daily. 90 tablet 3   No current facility-administered medications for this visit.    Family History  Problem Relation Age of Onset  . Hypertension Mother   . Hypertension Father   . Breast cancer Paternal Grandmother   . Diabetes Paternal Grandmother   . Hypertension Paternal Grandmother   . Hypertension Paternal Grandfather     Review of Systems  Constitutional: Negative.   HENT: Negative.   Eyes: Negative.   Respiratory: Negative.   Cardiovascular:  Negative.   Gastrointestinal: Negative.   Endocrine: Negative.   Genitourinary: Negative.   Musculoskeletal: Negative.   Allergic/Immunologic: Negative.   Neurological: Negative.   Hematological: Negative.   Psychiatric/Behavioral: Negative.     Exam:   BP 100/62   Pulse 86   Temp 98.4 F (36.9 C)   Ht 5\' 2"  (1.575 m)   Wt 141 lb (64 kg)   LMP 06/20/2019   SpO2 98%   BMI 25.79 kg/m   Weight change: @WEIGHTCHANGE @ Height:   Height: 5\' 2"  (157.5 cm)  Ht Readings from Last 3 Encounters:  06/29/19 5\' 2"  (1.575 m)  01/31/19 5' 2.5" (1.588 m)  12/08/18 5' 2.5" (1.588 m)    General appearance: alert, cooperative and appears stated age   Pelvic: External genitalia:  no lesions              Urethra:  normal appearing urethra with no masses, tenderness or lesions              Bartholins and Skenes: normal                 Vagina: normal appearing vagina with normal color and discharge, no lesions              Cervix: no lesions and IUD string 3-4 cm, removed with ringed forceps.  Gae Dry chaperoned for the exam.  A:  Contraception management  P:   IUD removed  They will use condoms  Information on vasectomy given (from up to date)

## 2019-12-05 ENCOUNTER — Other Ambulatory Visit: Payer: Self-pay

## 2019-12-05 DIAGNOSIS — Z79899 Other long term (current) drug therapy: Secondary | ICD-10-CM

## 2019-12-05 DIAGNOSIS — K2100 Gastro-esophageal reflux disease with esophagitis, without bleeding: Secondary | ICD-10-CM

## 2019-12-05 NOTE — Telephone Encounter (Signed)
RF request for pantoprazole 40mg  LOV: 01/31/20 Next ov: na Last written: 12/08/2018  Please advise  I attempted to call pt to schedule appt with no response. Not able to message through MyChart either.  Medication pending

## 2019-12-20 ENCOUNTER — Telehealth: Payer: Self-pay

## 2019-12-20 NOTE — Telephone Encounter (Signed)
Patient refill request  pantoprazole (PROTONIX) 40 MG tablet [[id number]]    [facility] [address]

## 2019-12-21 ENCOUNTER — Other Ambulatory Visit: Payer: Self-pay

## 2019-12-21 DIAGNOSIS — Z79899 Other long term (current) drug therapy: Secondary | ICD-10-CM

## 2019-12-21 DIAGNOSIS — K2100 Gastro-esophageal reflux disease with esophagitis, without bleeding: Secondary | ICD-10-CM

## 2019-12-21 MED ORDER — PANTOPRAZOLE SODIUM 40 MG PO TBEC: 40.0000 mg | DELAYED_RELEASE_TABLET | Freq: Every day | ORAL | 0 refills | Status: DC

## 2019-12-29 ENCOUNTER — Other Ambulatory Visit: Payer: Self-pay | Admitting: Family Medicine

## 2019-12-29 DIAGNOSIS — Z79899 Other long term (current) drug therapy: Secondary | ICD-10-CM

## 2019-12-29 DIAGNOSIS — K2100 Gastro-esophageal reflux disease with esophagitis, without bleeding: Secondary | ICD-10-CM

## 2020-01-03 ENCOUNTER — Encounter: Payer: Self-pay | Admitting: Family Medicine

## 2020-01-03 ENCOUNTER — Other Ambulatory Visit: Payer: Self-pay

## 2020-01-03 ENCOUNTER — Ambulatory Visit (INDEPENDENT_AMBULATORY_CARE_PROVIDER_SITE_OTHER): Payer: 59 | Admitting: Family Medicine

## 2020-01-03 ENCOUNTER — Telehealth: Payer: Self-pay | Admitting: Family Medicine

## 2020-01-03 VITALS — BP 107/69 | HR 76 | Temp 99.5°F | Ht 62.0 in | Wt 149.0 lb

## 2020-01-03 DIAGNOSIS — Z23 Encounter for immunization: Secondary | ICD-10-CM

## 2020-01-03 DIAGNOSIS — Z0000 Encounter for general adult medical examination without abnormal findings: Secondary | ICD-10-CM | POA: Diagnosis not present

## 2020-01-03 DIAGNOSIS — Z789 Other specified health status: Secondary | ICD-10-CM

## 2020-01-03 DIAGNOSIS — Z130 Encounter for screening for diseases of the blood and blood-forming organs and certain disorders involving the immune mechanism: Secondary | ICD-10-CM

## 2020-01-03 DIAGNOSIS — E559 Vitamin D deficiency, unspecified: Secondary | ICD-10-CM | POA: Diagnosis not present

## 2020-01-03 DIAGNOSIS — K2100 Gastro-esophageal reflux disease with esophagitis, without bleeding: Secondary | ICD-10-CM

## 2020-01-03 DIAGNOSIS — R7989 Other specified abnormal findings of blood chemistry: Secondary | ICD-10-CM

## 2020-01-03 DIAGNOSIS — L659 Nonscarring hair loss, unspecified: Secondary | ICD-10-CM

## 2020-01-03 DIAGNOSIS — Z1159 Encounter for screening for other viral diseases: Secondary | ICD-10-CM | POA: Diagnosis not present

## 2020-01-03 DIAGNOSIS — E663 Overweight: Secondary | ICD-10-CM | POA: Diagnosis not present

## 2020-01-03 DIAGNOSIS — Z79899 Other long term (current) drug therapy: Secondary | ICD-10-CM | POA: Diagnosis not present

## 2020-01-03 DIAGNOSIS — Z1231 Encounter for screening mammogram for malignant neoplasm of breast: Secondary | ICD-10-CM

## 2020-01-03 DIAGNOSIS — Z131 Encounter for screening for diabetes mellitus: Secondary | ICD-10-CM

## 2020-01-03 LAB — COMPREHENSIVE METABOLIC PANEL
ALT: 19 U/L (ref 0–35)
AST: 18 U/L (ref 0–37)
Albumin: 4.7 g/dL (ref 3.5–5.2)
Alkaline Phosphatase: 52 U/L (ref 39–117)
BUN: 9 mg/dL (ref 6–23)
CO2: 25 mEq/L (ref 19–32)
Calcium: 10.2 mg/dL (ref 8.4–10.5)
Chloride: 105 mEq/L (ref 96–112)
Creatinine, Ser: 0.67 mg/dL (ref 0.40–1.20)
GFR: 97.48 mL/min (ref >60.00)
Glucose, Bld: 91 mg/dL (ref 70–99)
Potassium: 4.7 mEq/L (ref 3.5–5.1)
Sodium: 137 mEq/L (ref 135–145)
Total Bilirubin: 0.5 mg/dL (ref 0.2–1.2)
Total Protein: 7.6 g/dL (ref 6.0–8.3)

## 2020-01-03 LAB — LDL CHOLESTEROL, DIRECT: Direct LDL: 138 mg/dL

## 2020-01-03 LAB — LIPID PANEL
Cholesterol: 220 mg/dL — ABNORMAL HIGH (ref 0–200)
HDL: 51.2 mg/dL (ref >39.00)
NonHDL: 168.31
Total CHOL/HDL Ratio: 4
Triglycerides: 225 mg/dL — ABNORMAL HIGH (ref 0.0–149.0)
VLDL: 45 mg/dL — ABNORMAL HIGH (ref 0.0–40.0)

## 2020-01-03 LAB — CBC
HCT: 40.1 % (ref 36.0–46.0)
Hemoglobin: 13.6 g/dL (ref 12.0–15.0)
MCHC: 33.9 g/dL (ref 30.0–36.0)
MCV: 86.7 fl (ref 78.0–100.0)
Platelets: 253 10*3/uL (ref 150.0–400.0)
RBC: 4.63 Mil/uL (ref 3.87–5.11)
RDW: 13.1 % (ref 11.5–15.5)
WBC: 8.7 10*3/uL (ref 4.0–10.5)

## 2020-01-03 LAB — TSH: TSH: 2.57 u[IU]/mL (ref 0.35–4.50)

## 2020-01-03 LAB — T4, FREE: Free T4: 1.16 ng/dL (ref 0.60–1.60)

## 2020-01-03 LAB — HEMOGLOBIN A1C: Hgb A1c MFr Bld: 5.5 % (ref 4.6–6.5)

## 2020-01-03 LAB — VITAMIN D 25 HYDROXY (VIT D DEFICIENCY, FRACTURES): VITD: 39.61 ng/mL (ref 30.00–100.00)

## 2020-01-03 MED ORDER — FAMOTIDINE 20 MG PO TABS: 20.0000 mg | ORAL_TABLET | Freq: Two times a day (BID) | ORAL | 5 refills | Status: DC

## 2020-01-03 MED ORDER — PANTOPRAZOLE SODIUM 40 MG PO TBEC: 40.0000 mg | DELAYED_RELEASE_TABLET | Freq: Every day | ORAL | 3 refills | Status: DC

## 2020-01-03 NOTE — Telephone Encounter (Signed)
Please call patient Liver, kidney and thyroid function are normal Blood cell counts and electrolytes are normal Vitamin D levels are normal,-the high-dose vitamin D prescription is not needed at this time.  But she should continue to take an over-the-counter vitamin D of [phone] units daily to keep her levels in normal range.   Diabetes screening/A1c is normal at 5.5 Cholesterol panel overall is at goal and approximately the same as last year.  Her triglycerides are still mildly elevated at 225, goal less than 150.  Would encourage her to add fiber to her diet by adding a fiber supplement such as a serving of Metamucil a day.  Since the rest of her labs were normal and not the cause of her symptoms, and she still desires her iron panel testing please schedule her for lab appointment only.  We will call her with those results once received.  Must be scheduled in the next 2 weeks if desiring Order placed

## 2020-01-03 NOTE — Patient Instructions (Signed)
Health Maintenance, Female Adopting a healthy lifestyle and getting preventive care are important in promoting health and wellness. Ask your health care provider about:  The right schedule for you to have regular tests and exams.  Things you can do on your own to prevent diseases and keep yourself healthy. What should I know about diet, weight, and exercise? Eat a healthy diet   Eat a diet that includes plenty of vegetables, fruits, low-fat dairy products, and lean protein.  Do not eat a lot of foods that are high in solid fats, added sugars, or sodium. Maintain a healthy weight Body mass index (BMI) is used to identify weight problems. It estimates body fat based on height and weight. Your health care provider can help determine your BMI and help you achieve or maintain a healthy weight. Get regular exercise Get regular exercise. This is one of the most important things you can do for your health. Most adults should:  Exercise for at least 150 minutes each week. The exercise should increase your heart rate and make you sweat (moderate-intensity exercise).  Do strengthening exercises at least twice a week. This is in addition to the moderate-intensity exercise.  Spend less time sitting. Even light physical activity can be beneficial. Watch cholesterol and blood lipids Have your blood tested for lipids and cholesterol at 40 years of age, then have this test every 5 years. Have your cholesterol levels checked more often if:  Your lipid or cholesterol levels are high.  You are older than 40 years of age.  You are at high risk for heart disease. What should I know about cancer screening? Depending on your health history and family history, you may need to have cancer screening at various ages. This may include screening for:  Breast cancer.  Cervical cancer.  Colorectal cancer.  Skin cancer.  Lung cancer. What should I know about heart disease, diabetes, and high blood  pressure? Blood pressure and heart disease  High blood pressure causes heart disease and increases the risk of stroke. This is more likely to develop in people who have high blood pressure readings, are of African descent, or are overweight.  Have your blood pressure checked: ? Every 3-5 years if you are 18-39 years of age. ? Every year if you are 40 years old or older. Diabetes Have regular diabetes screenings. This checks your fasting blood sugar level. Have the screening done:  Once every three years after age 40 if you are at a normal weight and have a low risk for diabetes.  More often and at a younger age if you are overweight or have a high risk for diabetes. What should I know about preventing infection? Hepatitis B If you have a higher risk for hepatitis B, you should be screened for this virus. Talk with your health care provider to find out if you are at risk for hepatitis B infection. Hepatitis C Testing is recommended for:  Everyone born from 1945 through 1965.  Anyone with known risk factors for hepatitis C. Sexually transmitted infections (STIs)  Get screened for STIs, including gonorrhea and chlamydia, if: ? You are sexually active and are younger than 40 years of age. ? You are older than 40 years of age and your health care provider tells you that you are at risk for this type of infection. ? Your sexual activity has changed since you were last screened, and you are at increased risk for chlamydia or gonorrhea. Ask your health care provider if   you are at risk.  Ask your health care provider about whether you are at high risk for HIV. Your health care provider may recommend a prescription medicine to help prevent HIV infection. If you choose to take medicine to prevent HIV, you should first get tested for HIV. You should then be tested every 3 months for as long as you are taking the medicine. Pregnancy  If you are about to stop having your period (premenopausal) and  you may become pregnant, seek counseling before you get pregnant.  Take 400 to 800 micrograms (mcg) of folic acid every day if you become pregnant.  Ask for birth control (contraception) if you want to prevent pregnancy. Osteoporosis and menopause Osteoporosis is a disease in which the bones lose minerals and strength with aging. This can result in bone fractures. If you are 65 years old or older, or if you are at risk for osteoporosis and fractures, ask your health care provider if you should:  Be screened for bone loss.  Take a calcium or vitamin D supplement to lower your risk of fractures.  Be given hormone replacement therapy (HRT) to treat symptoms of menopause. Follow these instructions at home: Lifestyle  Do not use any products that contain nicotine or tobacco, such as cigarettes, e-cigarettes, and chewing tobacco. If you need help quitting, ask your health care provider.  Do not use street drugs.  Do not share needles.  Ask your health care provider for help if you need support or information about quitting drugs. Alcohol use  Do not drink alcohol if: ? Your health care provider tells you not to drink. ? You are pregnant, may be pregnant, or are planning to become pregnant.  If you drink alcohol: ? Limit how much you use to 0-1 drink a day. ? Limit intake if you are breastfeeding.  Be aware of how much alcohol is in your drink. In the [place] one drink equals one 12 oz bottle of beer (355 mL), one 5 oz glass of wine (148 mL), or one 1 oz glass of hard liquor (44 mL). General instructions  Schedule regular health, dental, and eye exams.  Stay current with your vaccines.  Tell your health care provider if: ? You often feel depressed. ? You have ever been abused or do not feel safe at home. Summary  Adopting a healthy lifestyle and getting preventive care are important in promoting health and wellness.  Follow your health care provider's instructions about healthy  diet, exercising, and getting tested or screened for diseases.  Follow your health care provider's instructions on monitoring your cholesterol and blood pressure. This information is not intended to replace advice given to you by your health care provider. Make sure you discuss any questions you have with your health care provider. Document Revised: 03/24/2018 Document Reviewed: 03/24/2018 Elsevier Patient Education  2020 Elsevier Inc.  

## 2020-01-03 NOTE — Progress Notes (Signed)
 This visit occurred during the SARS-CoV-2 public health emergency.  Safety protocols were in place, including screening questions prior to the visit, additional usage of staff PPE, and extensive cleaning of exam room while observing appropriate contact time as indicated for disinfecting solutions.    Patient ID: Sonya Clayton, female  DOB: 04/10/1981, 39 y.o.   MRN: [id number] Patient Care Team    Relationship Specialty Notifications Start End  Kuneff, Renee A, DO PCP - General Family Medicine  11/20/17   Jertson, Jill Evelyn, MD Consulting Physician Obstetrics and Gynecology  01/03/20     Chief Complaint  Patient presents with  . Annual Exam    pt is not fasting(pt had some milk in coffee)    Subjective:  Sonya Clayton is a 39 y.o.  Female  present for CPE. All past medical history, surgical history, allergies, family history, immunizations, medications and social history were updated in the electronic medical record today. All recent labs, ED visits and hospitalizations within the last year were reviewed.  Health maintenance:  Colonoscopy: no fhx. Routine screen at 44 Mammogram: PGM with breast cancer. Routine screen at 40 recommended.  Cervical cancer screening: last pap: 2020 per pt. No prior abnormal. Uncertain if co-test preformed. She has a IUD that will need removed next year. No LMP recorded. (Menstrual status: IUD). Immunizations: tdap 2016 UTD, Influenza UTD 2021 (today). COVID series completed. Infectious disease screening: HIV completed 2016, HEP c completed today DEXA:routine screen.  Assistive device: none Oxygen use:none Patient has a Dental home. Hospitalizations/ED visits: reviewed   Depression screen PHQ 2/9 01/03/2020 01/31/2019 11/20/2017  Decreased Interest 0 0 0  Down, Depressed, Hopeless 0 0 0  PHQ - 2 Score 0 0 0   No flowsheet data found.   Immunization History  Administered Date(s) Administered  . Influenza,inj,Quad PF,6+ Mos 03/05/2018,  12/15/2018, 01/03/2020  . PFIZER SARS-COV-2 Vaccination 07/26/2019, 08/16/2019    Past Medical History:  Diagnosis Date  . Chicken pox   . GERD (gastroesophageal reflux disease)   . Gestational diabetes   . Infertility, female   . Thyroid disease   . Vegetarian diet    Allergies  Allergen Reactions  . Penicillins Hives   Past Surgical History:  Procedure Laterality Date  . NO PAST SURGERIES    . WISDOM TOOTH EXTRACTION     Family History  Problem Relation Age of Onset  . Hypertension Mother   . Hypertension Father   . Breast cancer Paternal Grandmother   . Diabetes Paternal Grandmother   . Hypertension Paternal Grandmother   . Hypertension Paternal Grandfather    Social History   Social History Narrative   Marital status/children/pets: married, 2 children   Education/employment: B.S. Electrical eng.    Safety:      -Wears a bicycle helmet riding a bike: No     -smoke alarm in the home:Yes     - wears seatbelt: Yes     - Feels safe in their relationships: Yes    Allergies as of 01/03/2020      Reactions   Penicillins Hives      Medication List       Accurate as of January 03, 2020 12:37 PM. If you have any questions, ask your nurse or doctor.        STOP taking these medications   levonorgestrel 20 MCG/24HR IUD Commonly known as: MIRENA Stopped by: Renee Kuneff, DO     TAKE these medications   Biotin 10000 MCG Tabs Take 1   tablet by mouth daily.   famotidine 20 MG tablet Commonly known as: Pepcid Take 1 tablet (20 mg total) by mouth 2 (two) times daily. Started by: Howard Pouch, DO   multivitamin capsule Take 1 capsule by mouth daily.   pantoprazole 40 MG tablet Commonly known as: PROTONIX Take 1 tablet (40 mg total) by mouth daily.   Vitamin D3 20 MCG (800 UNIT) Tabs Take 1 tablet by mouth daily.       All past medical history, surgical history, allergies, family history, immunizations andmedications were updated in the EMR today and  reviewed under the history and medication portions of their EMR.     No results found for this or any previous visit (from the past 2160 hour(s)).  No results found.   ROS: 14 pt review of systems performed and negative (unless mentioned in an HPI)  Objective: BP 107/69   Pulse 76   Temp 99.5 F (37.5 C) (Oral)   Ht 5' 2" (1.575 m)   Wt 149 lb (67.6 kg)   LMP 12/18/2019   SpO2 98%   BMI 27.25 kg/m  Gen: Afebrile. No acute distress. Nontoxic in appearance, well-developed, well-nourished,  Pleasant female.  HENT: AT. [place]. Bilateral TM visualized and normal in appearance, normal external auditory canal. MMM, no oral lesions, adequate dentition. Bilateral nares within normal limits. Throat without erythema, ulcerations or exudates. no Cough on exam, no hoarseness on exam. Eyes:Pupils Equal Round Reactive to light, Extraocular movements intact,  Conjunctiva without redness, discharge or icterus. Neck/lymp/endocrine: Supple,no lymphadenopathy, no thyromegaly CV: RRR no murmur, no edema, +2/4 P posterior tibialis pulses. No JVD. Chest: CTAB, no wheeze, rhonchi or crackles. normal Respiratory effort. good Air movement. Abd: Soft. flat. NTND. BS present. no Masses palpated. No hepatosplenomegaly. No rebound tenderness or guarding. Skin: no rashes, purpura or petechiae. Warm and well-perfused. Skin intact. Neuro/Msk:  Normal gait. PERLA. EOMi. Alert. Oriented x3.  Cranial nerves II through XII intact. Muscle strength 5/5 upper/lower extremity. DTRs equal bilaterally. Psych: Normal affect, dress and demeanor. Normal speech. Normal thought content and judgment.   No exam data present  Assessment/plan: Sonya Clayton is a 39 y.o. female present for  CPE  Abnormal TSH TSH - T4, free Use of proton pump inhibitor therapy/Gastroesophageal reflux disease with esophagitis without hemorrhage Increased sx avoidance of triggers is key for her.   Continue protonix 40 QD Added pepcid BID -  pantoprazole (PROTONIX) 40 MG tablet; Take 1 tablet (40 mg total) by mouth daily.  Dispense: 90 tablet; Refill: 3  Need for influenza vaccination - Flu Vaccine QUAD 6+ mos PF IM (Fluarix Quad PF) Diabetes mellitus screening - Hemoglobin A1c Screening for deficiency anemia - CBC Vitamin D deficiency/vegan diet Daily supplement - Vitamin D (25 hydroxy) Need for hepatitis C screening test - Hepatitis C Antibody Encounter for screening mammogram for malignant neoplasm of breast - MM 3D Screening; Future Overweight (BMI 25.0-29.9) - Lipid pane Encounter for long-term current use of medication - Comp Met (CMET) Encounter for preventative adult health care examination Patient was encouraged to exercise greater than 150 minutes a week. Patient was encouraged to choose a diet filled with fresh fruits and vegetables, and lean meats. AVS provided to patient today for education/recommendation on gender specColonoscopy: no fhx. Routine screen at 66 Mammogram: PGM with breast cancer. Routine screen at 40 recommended.  Cervical cancer screening: last pap: 2020 per pt. No prior abnormal. Uncertain if co-test preformed. She has a IUD that will need removed next year.  No LMP recorded. (Menstrual status: IUD). Immunizations: tdap 2016 UTD, Influenza UTD 2021 (today). COVID series completed. Infectious disease screening: HIV completed 2016, HEP c completed today DEXA:routine screen. ific health and safety maintenance.  Return in about 1 year (around 01/02/2021) for CPE (30 min).   Orders Placed This Encounter  Procedures  . MM 3D SCREEN BREAST BILATERAL  . Flu Vaccine QUAD 6+ mos PF IM (Fluarix Quad PF)  . Hepatitis C Antibody  . CBC  . Comp Met (CMET)  . TSH  . Hemoglobin A1c  . Lipid panel  . Vitamin D (25 hydroxy)  . T4, free   Meds ordered this encounter  Medications  . pantoprazole (PROTONIX) 40 MG tablet    Sig: Take 1 tablet (40 mg total) by mouth daily.    Dispense:  90 tablet     Refill:  3  . famotidine (PEPCID) 20 MG tablet    Sig: Take 1 tablet (20 mg total) by mouth 2 (two) times daily.    Dispense:  60 tablet    Refill:  5   Referral Orders  No referral(s) requested today     Electronically signed by: Renee Kuneff, DO [facility]  

## 2020-01-04 LAB — HEPATITIS C ANTIBODY
Hepatitis C Ab: NONREACTIVE
SIGNAL TO CUT-OFF: 0.01 (ref <1.00)

## 2020-01-04 NOTE — Telephone Encounter (Signed)
Pt scheduled for 01/06/20 for lab visit. Will schedule 2 wk appt at nurse visit.

## 2020-01-06 ENCOUNTER — Other Ambulatory Visit: Payer: Self-pay

## 2020-01-06 ENCOUNTER — Ambulatory Visit (INDEPENDENT_AMBULATORY_CARE_PROVIDER_SITE_OTHER): Payer: 59

## 2020-01-06 DIAGNOSIS — L659 Nonscarring hair loss, unspecified: Secondary | ICD-10-CM

## 2020-01-10 ENCOUNTER — Telehealth: Payer: Self-pay | Admitting: Family Medicine

## 2020-01-10 NOTE — Telephone Encounter (Signed)
Please inform patient  Her iron levels are low normal.  Percent saturations are below normal.  And the iron stores are normal. She may benefit from a low-dose once daily iron supplementation 3 times a week.  Ferrous sulfate 325 mg, which contains 65 mg of elemental iron is over-the-counter.  Cautioned on possible constipation with use may need to use a stool softener or natural softener such as prune juice with use.

## 2020-01-11 LAB — IRON,TIBC AND FERRITIN PANEL
%SAT: 14 % (calc) — ABNORMAL LOW (ref 16–45)
Ferritin: 67 ng/mL (ref 16–154)
Iron: 48 ug/dL (ref 40–190)
TIBC: 348 mcg/dL (calc) (ref 250–450)

## 2020-01-12 NOTE — Telephone Encounter (Signed)
Spoke with pt regarding labs and instructions.   

## 2020-01-31 ENCOUNTER — Other Ambulatory Visit: Payer: Self-pay

## 2020-01-31 ENCOUNTER — Ambulatory Visit
Admission: RE | Admit: 2020-01-31 | Discharge: 2020-01-31 | Disposition: A | Discharge status: Home / Self Care | Payer: 59 | Source: Ambulatory Visit | Attending: Family Medicine | Admitting: Family Medicine

## 2020-01-31 DIAGNOSIS — Z1231 Encounter for screening mammogram for malignant neoplasm of breast: Secondary | ICD-10-CM

## 2020-11-15 ENCOUNTER — Telehealth (INDEPENDENT_AMBULATORY_CARE_PROVIDER_SITE_OTHER): Payer: 59 | Admitting: Family Medicine

## 2020-11-15 ENCOUNTER — Encounter: Payer: Self-pay | Admitting: Family Medicine

## 2020-11-15 DIAGNOSIS — R519 Headache, unspecified: Secondary | ICD-10-CM | POA: Diagnosis not present

## 2020-11-15 DIAGNOSIS — R0981 Nasal congestion: Secondary | ICD-10-CM

## 2020-11-15 MED ORDER — DOXYCYCLINE HYCLATE 100 MG PO TABS: 100.0000 mg | ORAL_TABLET | Freq: Two times a day (BID) | ORAL | 0 refills | Status: DC

## 2020-11-15 NOTE — Progress Notes (Signed)
Virtual Visit via Video Note  I connected with Sonya Clayton  on 11/15/20 at  1:00 PM EDT by a video enabled telemedicine application and verified that I am speaking with the correct person using two identifiers.  Location patient: home, [place] Location provider:work or home office Persons participating in the virtual visit: patient, provider  I discussed the limitations of evaluation and management by telemedicine and the availability of in person appointments. The patient expressed understanding and agreed to proceed.   HPI:  Acute telemedicine visit for sinus issues: -Onset: 2 weeks - she thought was sinus related. Worse the last 2 days.  -Symptoms include: intermittent sinus issues, sinus pain and pressure, eye irritation/clear drainage the last few days ago, some body aches and has a headache today and feels tired -Denies:fevers, NVD, CP, SOB, tick bite, severe headache, vision change, sick contacts -Has tried:ibuprofen -Pertinent past medical history:see below -FDLMP 11/07/20 (sometimes feels unwell with her menstrual cycles) -Pertinent medication allergies: Allergies  Allergen Reactions   Penicillins Hives  -COVID-19 vaccine status: vaccinated twice and has booster  ROS: See pertinent positives and negatives per HPI.  Past Medical History:  Diagnosis Date   Chicken pox    GERD (gastroesophageal reflux disease)    Gestational diabetes    Infertility, female    Thyroid disease    Vegetarian diet     Past Surgical History:  Procedure Laterality Date   NO PAST SURGERIES     WISDOM TOOTH EXTRACTION       Current Outpatient Medications:    Biotin 33354 MCG TABS, Take 1 tablet by mouth daily., Disp: , Rfl:    Cholecalciferol (VITAMIN D3) 20 MCG (800 UNIT) TABS, Take 1 tablet by mouth daily., Disp: , Rfl:    doxycycline (VIBRA-TABS) 100 MG tablet, Take 1 tablet (100 mg total) by mouth 2 (two) times daily., Disp: 20 tablet, Rfl: 0   Multiple Vitamin (MULTIVITAMIN) capsule, Take 1  capsule by mouth daily., Disp: , Rfl:    pantoprazole (PROTONIX) 40 MG tablet, Take 1 tablet (40 mg total) by mouth daily., Disp: 90 tablet, Rfl: 3  EXAM:  VITALS per patient if applicable:  GENERAL: alert, oriented, appears well and in no acute distress  HEENT: atraumatic, conjunttiva clear, no obvious abnormalities on inspection of external nose and ears  NECK: normal movements of the head and neck  LUNGS: on inspection no signs of respiratory distress, breathing rate appears normal, no obvious gross SOB, gasping or wheezing, did cough occasionally during visit  CV: no obvious cyanosis  MS: moves all visible extremities without noticeable abnormality  PSYCH/NEURO: pleasant and cooperative, no obvious depression or anxiety, speech and thought processing grossly intact  ASSESSMENT AND PLAN:  Discussed the following assessment and plan:  Nonintractable headache, unspecified chronicity pattern, unspecified headache type  Sinus congestion  -we discussed possible serious and likely etiologies, options for evaluation and workup, limitations of telemedicine visit vs in person visit, treatment, treatment risks and precautions. Pt prefers to treat via telemedicine empirically rather than in person at this moment. Query [phone] (given high transmission in community currently,) possible sinusitis, vs other. She agrees to do home covid testing and wants to try empiric doxy for a possible sinusitis as reports a hx of sinusitis which felt the same to her. Advised if covid test positive to not take the abx and to follow up via video visit with PCP or [place]. OTher symptomatic care measures provided in patient instructions.  Scheduled follow up with PCP offered: she opted  for prn follow up with PCP or [facility] or via video visits Advised to seek prompt in person care if worsening, new symptoms arise, or if is not improving with treatment. Discussed options for inperson care if PCP office not  available. Did let this patient know that I only do telemedicine on Tuesdays and Thursdays for . Advised to schedule follow up visit with PCP or [facility] if any further questions or concerns to avoid delays in care.   I discussed the assessment and treatment plan with the patient. The patient was provided an opportunity to ask questions and all were answered. The patient agreed with the plan and demonstrated an understanding of the instructions.     Terressa Koyanagi, DO

## 2020-11-15 NOTE — Patient Instructions (Signed)
  HOME CARE TIPS:  -[place] COVID19 testing information: [email or web address] OR [phone] Most pharmacies also offer testing and home test kits. If the Covid19 test is positive, please make a prompt follow up visit with your primary care office or with [facility] to discuss treatment options. Treatments for Covid19 are best given early in the course of the illness.   -I sent the medication(s) we discussed to your pharmacy: Meds ordered this encounter  Medications   doxycycline (VIBRA-TABS) 100 MG tablet    Sig: Take 1 tablet (100 mg total) by mouth 2 (two) times daily.    Dispense:  20 tablet    Refill:  0   Do not take the antibiotic if the covid test is positive.   -can use tylenol or aleve if needed for fevers, aches and pains per instructions  -can use nasal saline a few times per day if you have nasal congestion; sometimes  a short course of Afrin nasal spray for 3 days can help with symptoms as well  -stay hydrated, drink plenty of fluids and eat small healthy meals - avoid dairy  -can take 1000 IU ( ) Vit D3 and 100-500 mg of Vit C daily per instructions  -If the Covid test is positive, check out the [facility] website for more information on home care, transmission and treatment for COVID19  -follow up with your doctor in 2-3 days unless improving and feeling better  -stay home while sick, except to seek medical care. If you have COVID19, ideally it would be best to stay home for a full 10 days since the onset of symptoms PLUS one day of no fever and feeling better. Wear a good mask that fits snugly (such as N95 or KN95) if around others to reduce the risk of transmission.  It was nice to meet you today, and I really hope you are feeling better soon. I help [facility] out with telemedicine visits on Tuesdays and Thursdays and am available for visits on those days. If you have any concerns or questions following this visit please schedule  a follow up visit with your Primary Care doctor or seek care at a local urgent care clinic to avoid delays in care.    Seek in person care or schedule a follow up video visit promptly if your symptoms worsen, new concerns arise or you are not improving with treatment. Call 911 and/or seek emergency care if your symptoms are severe or life threatening.

## 2020-12-10 ENCOUNTER — Encounter: Payer: Self-pay | Admitting: Family Medicine

## 2020-12-10 ENCOUNTER — Ambulatory Visit (INDEPENDENT_AMBULATORY_CARE_PROVIDER_SITE_OTHER): Payer: 59 | Admitting: Family Medicine

## 2020-12-10 ENCOUNTER — Other Ambulatory Visit: Payer: Self-pay

## 2020-12-10 VITALS — BP 114/73 | HR 76 | Temp 97.9°F | Wt 151.4 lb

## 2020-12-10 DIAGNOSIS — J0100 Acute maxillary sinusitis, unspecified: Secondary | ICD-10-CM | POA: Diagnosis not present

## 2020-12-10 DIAGNOSIS — J301 Allergic rhinitis due to pollen: Secondary | ICD-10-CM

## 2020-12-10 MED ORDER — FLUTICASONE PROPIONATE 50 MCG/ACT NA SUSP: 2.0000 | Freq: Every day | NASAL | 6 refills | Status: DC

## 2020-12-10 MED ORDER — PREDNISONE 20 MG PO TABS: 40.0000 mg | ORAL_TABLET | Freq: Every day | ORAL | 0 refills | Status: DC

## 2020-12-10 NOTE — Patient Instructions (Signed)
Use nasal saline in the morning and evening followed by flonase each time for at least 3-4 weeks.   Start prednisone pack for 5 days.   Sinusitis, Adult Sinusitis is soreness and swelling (inflammation) of your sinuses. Sinuses are hollow spaces in the bones around your face. They are located: Around your eyes. In the middle of your forehead. Behind your nose. In your cheekbones. Your sinuses and nasal passages are lined with a fluid called mucus. Mucus drains out of your sinuses. Swelling can trap mucus in your sinuses. This lets germs (bacteria, virus, or fungus) grow, which leads to infection. Most of the time, this condition is caused bya virus. What are the causes? This condition is caused by: Allergies. Asthma. Germs. Things that block your nose or sinuses. Growths in the nose (nasal polyps). Chemicals or irritants in the air. Fungus (rare). What increases the risk? You are more likely to develop this condition if: You have a weak body defense system (immune system). You do a lot of swimming or diving. You use nasal sprays too much. You smoke. What are the signs or symptoms? The main symptoms of this condition are pain and a feeling of pressure around the sinuses. Other symptoms include: Stuffy nose (congestion). Runny nose (drainage). Swelling and warmth in the sinuses. Headache. Toothache. A cough that may get worse at night. Mucus that collects in the throat or the back of the nose (postnasal drip). Being unable to smell and taste. Being very tired (fatigue). A fever. Sore throat. Bad breath. How is this diagnosed? This condition is diagnosed based on: Your symptoms. Your medical history. A physical exam. Tests to find out if your condition is short-term (acute) or long-term (chronic). Your doctor may: Check your nose for growths (polyps). Check your sinuses using a tool that has a light (endoscope). Check for allergies or germs. Do imaging tests, such as an  MRI or CT scan. How is this treated? Treatment for this condition depends on the cause and whether it is short-term or long-term. If caused by a virus, your symptoms should go away on their own within 10 days. You may be given medicines to relieve symptoms. They include: Medicines that shrink swollen tissue in the nose. Medicines that treat allergies (antihistamines). A spray that treats swelling of the nostrils.  Rinses that help get rid of thick mucus in your nose (nasal saline washes). If caused by bacteria, your doctor may wait to see if you will get better without treatment. You may be given antibiotic medicine if you have: A very bad infection. A weak body defense system. If caused by growths in the nose, you may need to have surgery. Follow these instructions at home: Medicines Take, use, or apply over-the-counter and prescription medicines only as told by your doctor. These may include nasal sprays. If you were prescribed an antibiotic medicine, take it as told by your doctor. Do not stop taking the antibiotic even if you start to feel better. Hydrate and humidify  Drink enough water to keep your pee (urine) pale yellow. Use a cool mist humidifier to keep the humidity level in your home above 50%. Breathe in steam for 10-15 minutes, 3-4 times a day, or as told by your doctor. You can do this in the bathroom while a hot shower is running. Try not to spend time in cool or dry air.  Rest Rest as much as you can. Sleep with your head raised (elevated). Make sure you get enough sleep each night.  General instructions  Put a warm, moist washcloth on your face 3-4 times a day, or as often as told by your doctor. This will help with discomfort. Wash your hands often with soap and water. If there is no soap and water, use hand sanitizer. Do not smoke. Avoid being around people who are smoking (secondhand smoke). Keep all follow-up visits as told by your doctor. This is  important.  Contact a doctor if: You have a fever. Your symptoms get worse. Your symptoms do not get better within 10 days. Get help right away if: You have a very bad headache. You cannot stop throwing up (vomiting). You have very bad pain or swelling around your face or eyes. You have trouble seeing. You feel confused. Your neck is stiff. You have trouble breathing. Summary Sinusitis is swelling of your sinuses. Sinuses are hollow spaces in the bones around your face. This condition is caused by tissues in your nose that become inflamed or swollen. This traps germs. These can lead to infection. If you were prescribed an antibiotic medicine, take it as told by your doctor. Do not stop taking it even if you start to feel better. Keep all follow-up visits as told by your doctor. This is important. This information is not intended to replace advice given to you by your health care provider. Make sure you discuss any questions you have with your healthcare provider. Document Revised: 08/31/2017 Document Reviewed: 08/31/2017 Elsevier Patient Education  2022 [employer].

## 2020-12-10 NOTE — Progress Notes (Signed)
This visit occurred during the SARS-CoV-2 public health emergency.  Safety protocols were in place, including screening questions prior to the visit, additional usage of staff PPE, and extensive cleaning of exam room while observing appropriate contact time as indicated for disinfecting solutions.    Sonya Clayton , 11-16-1980, 40 y.o., female MRN: [id number] Patient Care Team    Relationship Specialty Notifications Start End  Natalia Leatherwood, DO PCP - General Family Medicine  11/20/17   Romualdo Bolk, MD Consulting Physician Obstetrics and Gynecology  01/03/20     Chief Complaint  Patient presents with   Sinusitis    3 wk sinus infection f/u. Sx resolved;      Subjective: Pt presents for an OV with complaints of sinusitis. She was seen by another provider virtually and prescribed doxy BID x 10d. She was using flonase, but since has stopped. She states her symptoms are starting to improve, but she is having residual sinus pressure. She denies fever or chills.   Depression screen [facility] 2/9 12/10/2020 01/03/2020 01/31/2019 11/20/2017  Decreased Interest 0 0 0 0  Down, Depressed, Hopeless 0 0 0 0  PHQ - 2 Score 0 0 0 0    Allergies  Allergen Reactions   Penicillins Hives   Social History   Social History Narrative   Marital status/children/pets: married, 2 children   Education/employment: B.S. Electrical eng.    Safety:      -Wears a bicycle helmet riding a bike: No     -smoke alarm in the home:Yes     - wears seatbelt: Yes     - Feels safe in their relationships: Yes   Past Medical History:  Diagnosis Date   Chicken pox    GERD (gastroesophageal reflux disease)    Gestational diabetes    Infertility, female    Thyroid disease    Vegetarian diet    Past Surgical History:  Procedure Laterality Date   NO PAST SURGERIES     WISDOM TOOTH EXTRACTION     Family History  Problem Relation Age of Onset   Hypertension Mother    Hypertension Father    Breast cancer  Paternal Grandmother    Diabetes Paternal Grandmother    Hypertension Paternal Grandmother    Hypertension Paternal Grandfather    Allergies as of 12/10/2020       Reactions   Penicillins Hives        Medication List        Accurate as of December 10, 2020  2:08 PM. If you have any questions, ask your nurse or doctor.          Biotin 32671 MCG Tabs Take 1 tablet by mouth daily.   doxycycline 100 MG tablet Commonly known as: VIBRA-TABS Take 1 tablet (100 mg total) by mouth 2 (two) times daily.   fluticasone 50 MCG/ACT nasal spray Commonly known as: FLONASE Place 2 sprays into both nostrils daily. Started by: Felix Pacini, DO   multivitamin capsule Take 1 capsule by mouth daily.   pantoprazole 40 MG tablet Commonly known as: PROTONIX Take 1 tablet (40 mg total) by mouth daily.   predniSONE 20 MG tablet Commonly known as: DELTASONE Take 2 tablets (40 mg total) by mouth daily with breakfast. Started by: Felix Pacini, DO   Vitamin D3 20 MCG (800 UNIT) Tabs Take 1 tablet by mouth daily.        All past medical history, surgical history, allergies, family history, immunizations andmedications were updated  in the EMR today and reviewed under the history and medication portions of their EMR.     ROS: Negative, with the exception of above mentioned in HPI   Objective:  BP 114/73   Pulse 76   Temp 97.9 F (36.6 C) (Oral)   Wt 151 lb 6.4 oz (68.7 kg)   SpO2 96%   BMI 27.69 kg/m  Body mass index is 27.69 kg/m. Gen: Afebrile. No acute distress. Nontoxic in appearance, well developed, well nourished.  HENT: AT. [place]. Bilateral TM visualized with mild effusion right. MMM, no oral lesions. Bilateral nares with significant swelling and redness bilaterally Right>left. . Throat without erythema or exudates. No cough or hoarseness.  Eyes:Pupils Equal Round Reactive to light, Extraocular movements intact,  Conjunctiva without redness, discharge or  icterus. Neck/lymp/endocrine: Supple,no lymphadenopathy CV: RRR  Chest: CTAB, no wheeze or crackles. Good air movement, normal resp effort.  Skin: no rashes, purpura or petechiae.  Neuro:  Normal gait. PERLA. EOMi. Alert. Oriented x3 C Psych: Normal affect, dress and demeanor. Normal speech. Normal thought content and judgment.  No results found. No results found. No results found for this or any previous visit (from the past 24 hour(s)).  Assessment/Plan: Sonya Clayton is a 40 y.o. female present for OV for  Allergic rhinitis due to pollen, unspecified seasonality/subacute maxillary sinusitis Symptoms are improving after completion of doxy. She still has significant sinusitis and would benefit from course of steroid.  Prednisone burst x5 days.  Restart nasal saline flushes BID and flonase BID.  F/u PRN  Reviewed expectations re: course of current medical issues. Discussed self-management of symptoms. Outlined signs and symptoms indicating need for more acute intervention. Patient verbalized understanding and all questions were answered. Patient received an After-Visit Summary.    No orders of the defined types were placed in this encounter.  Meds ordered this encounter  Medications   predniSONE (DELTASONE) 20 MG tablet    Sig: Take 2 tablets (40 mg total) by mouth daily with breakfast.    Dispense:  10 tablet    Refill:  0   fluticasone (FLONASE) 50 MCG/ACT nasal spray    Sig: Place 2 sprays into both nostrils daily.    Dispense:  16 g    Refill:  6   Referral Orders  No referral(s) requested today     Note is dictated utilizing voice recognition software. Although note has been proof read prior to signing, occasional typographical errors still can be missed. If any questions arise, please do not hesitate to call for verification.   electronically signed by:  Felix Pacini, DO  [facility] [place]

## 2021-01-31 ENCOUNTER — Other Ambulatory Visit: Payer: Self-pay | Admitting: Family Medicine

## 2021-01-31 DIAGNOSIS — Z79899 Other long term (current) drug therapy: Secondary | ICD-10-CM

## 2021-02-19 ENCOUNTER — Telehealth: Payer: Self-pay

## 2021-02-19 DIAGNOSIS — Z79899 Other long term (current) drug therapy: Secondary | ICD-10-CM

## 2021-02-19 MED ORDER — PANTOPRAZOLE SODIUM 40 MG PO TBEC
40.0000 mg | DELAYED_RELEASE_TABLET | Freq: Every day | ORAL | 0 refills | Status: DC
Start: 2021-02-19 — End: 2021-03-12

## 2021-02-19 NOTE — Telephone Encounter (Signed)
Patient refill request.  Patient has CPE scheduled on 05/01/2021 with Dr. Claiborne Billings.  This was first available appt with provider.  [facility]  pantoprazole (PROTONIX) 40 MG tablet [[id number]]

## 2021-02-19 NOTE — Telephone Encounter (Signed)
Rx sent 

## 2021-03-12 ENCOUNTER — Other Ambulatory Visit: Payer: Self-pay

## 2021-03-12 ENCOUNTER — Other Ambulatory Visit: Payer: Self-pay | Admitting: Family Medicine

## 2021-03-12 ENCOUNTER — Telehealth (INDEPENDENT_AMBULATORY_CARE_PROVIDER_SITE_OTHER): Payer: 59 | Admitting: Family Medicine

## 2021-03-12 ENCOUNTER — Encounter: Payer: Self-pay | Admitting: Family Medicine

## 2021-03-12 VITALS — Temp 96.9°F | Ht 62.0 in | Wt 148.6 lb

## 2021-03-12 DIAGNOSIS — R0982 Postnasal drip: Secondary | ICD-10-CM

## 2021-03-12 DIAGNOSIS — K2100 Gastro-esophageal reflux disease with esophagitis, without bleeding: Secondary | ICD-10-CM

## 2021-03-12 DIAGNOSIS — J301 Allergic rhinitis due to pollen: Secondary | ICD-10-CM

## 2021-03-12 DIAGNOSIS — R051 Acute cough: Secondary | ICD-10-CM | POA: Diagnosis not present

## 2021-03-12 DIAGNOSIS — K5900 Constipation, unspecified: Secondary | ICD-10-CM

## 2021-03-12 DIAGNOSIS — Z1231 Encounter for screening mammogram for malignant neoplasm of breast: Secondary | ICD-10-CM

## 2021-03-12 MED ORDER — LEVOCETIRIZINE DIHYDROCHLORIDE 5 MG PO TABS: 5.0000 mg | ORAL_TABLET | Freq: Every evening | ORAL | 3 refills | Status: DC

## 2021-03-12 MED ORDER — PANTOPRAZOLE SODIUM 40 MG PO TBEC: 40.0000 mg | DELAYED_RELEASE_TABLET | Freq: Every day | ORAL | 3 refills | Status: DC

## 2021-03-12 MED ORDER — FAMOTIDINE 20 MG PO TABS: 20.0000 mg | ORAL_TABLET | Freq: Two times a day (BID) | ORAL | 5 refills | Status: DC

## 2021-03-12 NOTE — Progress Notes (Signed)
VIRTUAL VISIT VIA VIDEO  I connected with Sonya Clayton on 03/12/21 at  2:00 PM EST by elemedicine application and verified that I am speaking with the correct person using two identifiers. Location patient: Home Location provider: [facility], Office Persons participating in the virtual visit: Patient, Dr. Claiborne Billings and Reece Agar. Cesar, CMA  I discussed the limitations of evaluation and management by telemedicine and the availability of in person appointments. The patient expressed understanding and agreed to proceed.   SUBJECTIVE Chief Complaint  Patient presents with   Abdominal Pain    Pt c/o LLQ abd pain, bloating, urinary freq, constipation, dry cough after stopping GERD rx for 3 weeks x 2.5 weeks; pt was given abx for dry cough;     HPI: Sonya Clayton is a 40 y.o. female present for acute concern of dry cough. She has a h/o GERD. She noticed cough started after she stopped her Protonix. She then also had a  resp illness that was treated w/ z-pack. She restarted her protonix on 11/18. Her children then became ill with viral illness and she believes she again has an illness causing worsening cough. She also has noticed urinary freq, constipation and lower abd discomfort. She denies fever, chills, nausea or vomit.   ROS: See pertinent positives and negatives per HPI.  Patient Active Problem List   Diagnosis Date Noted   Vitamin D deficiency 12/08/2018   Abnormal TSH 11/20/2017   Hair loss 11/20/2017   Vegetarian diet 11/20/2017   Use of proton pump inhibitor therapy 11/20/2017   Gastroesophageal reflux disease with esophagitis 11/20/2017    Social History   Tobacco Use   Smoking status: Never   Smokeless tobacco: Never  Substance Use Topics   Alcohol use: Never    Current Outpatient Medications:    Biotin 41583 MCG TABS, Take 1 tablet by mouth daily., Disp: , Rfl:    Cholecalciferol (VITAMIN D3) 20 MCG (800 UNIT) TABS, Take 1 tablet by mouth daily., Disp: , Rfl:     famotidine (PEPCID) 20 MG tablet, Take 1 tablet (20 mg total) by mouth 2 (two) times daily., Disp: 60 tablet, Rfl: 5   fluticasone (FLONASE) 50 MCG/ACT nasal spray, Place 2 sprays into both nostrils daily., Disp: 16 g, Rfl: 6   levocetirizine (XYZAL) 5 MG tablet, Take 1 tablet (5 mg total) by mouth every evening., Disp: 30 tablet, Rfl: 3   Multiple Vitamin (MULTIVITAMIN) capsule, Take 1 capsule by mouth daily., Disp: , Rfl:    pantoprazole (PROTONIX) 40 MG tablet, Take 1 tablet (40 mg total) by mouth daily., Disp: 30 tablet, Rfl: 3  Allergies  Allergen Reactions   Penicillins Hives    OBJECTIVE: Temp (!) 96.9 F (36.1 C) (Temporal)   Ht 5\' 2"  (1.575 m)   Wt 148 lb 9.4 oz (67.4 kg)   BMI 27.18 kg/m  Gen: No acute distress. Nontoxic in appearance.  HENT: AT. [place].  MMM.  Eyes:Pupils Equal Round Reactive to light, Extraocular movements intact,  Conjunctiva without redness, discharge or icterus. Chest: Cough present on exam- no shortness of breath.  Psych: Normal affect and demeanor. Normal speech. Normal thought content and judgment.  ASSESSMENT AND PLAN: Sonya Clayton is a 40 y.o. female present for  Gastroesophageal reflux disease with esophagitis without hemorrhage Continue protonix Start pepcid BID  Seasonal allergic rhinitis due to pollen/PND (post-nasal drip) Continue flonase Start xyzal qhs  Acute cough Likely multifactorial. - Encouraged better coverage of allergies and GERD  Constipation - hydrate Start  miralax or senakot If sx worsen she is encouraged to make an in person visit for full eval  Felix Pacini, DO 03/12/2021   No follow-ups on file.  No orders of the defined types were placed in this encounter.  Meds ordered this encounter  Medications   pantoprazole (PROTONIX) 40 MG tablet    Sig: Take 1 tablet (40 mg total) by mouth daily.    Dispense:  30 tablet    Refill:  3   famotidine (PEPCID) 20 MG tablet    Sig: Take 1 tablet (20 mg total) by mouth  2 (two) times daily.    Dispense:  60 tablet    Refill:  5   levocetirizine (XYZAL) 5 MG tablet    Sig: Take 1 tablet (5 mg total) by mouth every evening.    Dispense:  30 tablet    Refill:  3   Referral Orders  No referral(s) requested today

## 2021-03-12 NOTE — Patient Instructions (Signed)
Food Choices for Gastroesophageal Reflux Disease, Adult °When you have gastroesophageal reflux disease (GERD), the foods you eat and your eating habits are very important. Choosing the right foods can help ease the discomfort of GERD. Consider working with a dietitian to help you make healthy food choices. °What are tips for following this plan? °Reading food labels °Look for foods that are low in saturated fat. Foods that have less than 5% of daily value (DV) of fat and 0 g of trans fats may help with your symptoms. °Cooking °Cook foods using methods other than frying. This may include baking, steaming, grilling, or broiling. These are all methods that do not need a lot of fat for cooking. °To add flavor, try to use herbs that are low in spice and acidity. °Meal planning ° °Choose healthy foods that are low in fat, such as fruits, vegetables, whole grains, low-fat dairy products, lean meats, fish, and poultry. °Eat frequent, small meals instead of three large meals each day. Eat your meals slowly, in a relaxed setting. Avoid bending over or lying down until 2-3 hours after eating. °Limit high-fat foods such as fatty meats or fried foods. °Limit your intake of fatty foods, such as oils, butter, and shortening. °Avoid the following as told by your health care provider: °Foods that cause symptoms. These may be different for different people. Keep a food diary to keep track of foods that cause symptoms. °Alcohol. °Drinking large amounts of liquid with meals. °Eating meals during the 2-3 hours before bed. °Lifestyle °Maintain a healthy weight. Ask your health care provider what weight is healthy for you. If you need to lose weight, work with your health care provider to do so safely. °Exercise for at least 30 minutes on 5 or more days each week, or as told by your health care provider. °Avoid wearing clothes that fit tightly around your waist and chest. °Do not use any products that contain nicotine or tobacco. These  products include cigarettes, chewing tobacco, and vaping devices, such as e-cigarettes. If you need help quitting, ask your health care provider. °Sleep with the head of your bed raised. Use a wedge under the mattress or blocks under the bed frame to raise the head of the bed. °Chew sugar-free gum after mealtimes. °What foods should I eat? °Eat a healthy, well-balanced diet of fruits, vegetables, whole grains, low-fat dairy products, lean meats, fish, and poultry. Each person is different. Foods that may trigger symptoms in one person may not trigger any symptoms in another person. Work with your health care provider to identify foods that are safe for you. °The items listed above may not be a complete list of recommended foods and beverages. Contact a dietitian for more information. °What foods should I avoid? °Limiting some of these foods may help manage the symptoms of GERD. Everyone is different. Consult a dietitian or your health care provider to help you identify the exact foods to avoid, if any. °Fruits °Any fruits prepared with added fat. Any fruits that cause symptoms. For some people this may include citrus fruits, such as oranges, grapefruit, pineapple, and lemons. °Vegetables °Deep-fried vegetables. French fries. Any vegetables prepared with added fat. Any vegetables that cause symptoms. For some people, this may include tomatoes and tomato products, chili peppers, onions and garlic, and horseradish. °Grains °Pastries or quick breads with added fat. °Meats and other proteins °High-fat meats, such as fatty beef or pork, hot dogs, ribs, ham, sausage, salami, and bacon. Fried meat or protein, including fried   fish and fried chicken. Nuts and nut butters, in large amounts. °Dairy °Whole milk and chocolate milk. Sour cream. Cream. Ice cream. Cream cheese. Milkshakes. °Fats and oils °Butter. Margarine. Shortening. Ghee. °Beverages °Coffee and tea, with or without caffeine. Carbonated beverages. Sodas. Energy  drinks. Fruit juice made with acidic fruits, such as orange or grapefruit. Tomato juice. Alcoholic drinks. °Sweets and desserts °Chocolate and cocoa. Donuts. °Seasonings and condiments °Pepper. Peppermint and spearmint. Added salt. Any condiments, herbs, or seasonings that cause symptoms. For some people, this may include curry, hot sauce, or vinegar-based salad dressings. °The items listed above may not be a complete list of foods and beverages to avoid. Contact a dietitian for more information. °Questions to ask your health care provider °Diet and lifestyle changes are usually the first steps that are taken to manage symptoms of GERD. If diet and lifestyle changes do not improve your symptoms, talk with your health care provider about taking medicines. °Where to find more information °[employer] Gastrointestinal Disorders: aboutgerd.org °Summary °When you have gastroesophageal reflux disease (GERD), food and lifestyle choices may be very helpful in easing the discomfort of GERD. °Eat frequent, small meals instead of three large meals each day. Eat your meals slowly, in a relaxed setting. Avoid bending over or lying down until 2-3 hours after eating. °Limit high-fat foods such as fatty meats or fried foods. °This information is not intended to replace advice given to you by your health care provider. Make sure you discuss any questions you have with your health care provider. °Document Revised: 10/10/2019 Document Reviewed: 10/10/2019 °Elsevier Patient Education © 2022 [employer] ° °

## 2021-04-19 ENCOUNTER — Ambulatory Visit
Admission: RE | Admit: 2021-04-19 | Discharge: 2021-04-19 | Disposition: A | Discharge status: Home / Self Care | Payer: 59 | Source: Ambulatory Visit | Attending: Family Medicine | Admitting: Family Medicine

## 2021-04-19 DIAGNOSIS — Z1231 Encounter for screening mammogram for malignant neoplasm of breast: Secondary | ICD-10-CM

## 2021-04-30 ENCOUNTER — Other Ambulatory Visit: Payer: Self-pay

## 2021-05-01 ENCOUNTER — Ambulatory Visit (INDEPENDENT_AMBULATORY_CARE_PROVIDER_SITE_OTHER): Payer: 59 | Admitting: Family Medicine

## 2021-05-01 ENCOUNTER — Encounter: Payer: Self-pay | Admitting: Family Medicine

## 2021-05-01 VITALS — BP 108/65 | HR 63 | Temp 98.1°F | Ht 62.6 in | Wt 145.4 lb

## 2021-05-01 DIAGNOSIS — Z1159 Encounter for screening for other viral diseases: Secondary | ICD-10-CM

## 2021-05-01 DIAGNOSIS — Z131 Encounter for screening for diabetes mellitus: Secondary | ICD-10-CM | POA: Diagnosis not present

## 2021-05-01 DIAGNOSIS — Z13220 Encounter for screening for lipoid disorders: Secondary | ICD-10-CM

## 2021-05-01 DIAGNOSIS — E611 Iron deficiency: Secondary | ICD-10-CM | POA: Diagnosis not present

## 2021-05-01 DIAGNOSIS — R7989 Other specified abnormal findings of blood chemistry: Secondary | ICD-10-CM | POA: Diagnosis not present

## 2021-05-01 DIAGNOSIS — R1084 Generalized abdominal pain: Secondary | ICD-10-CM

## 2021-05-01 DIAGNOSIS — Z0001 Encounter for general adult medical examination with abnormal findings: Secondary | ICD-10-CM

## 2021-05-01 DIAGNOSIS — E559 Vitamin D deficiency, unspecified: Secondary | ICD-10-CM | POA: Diagnosis not present

## 2021-05-01 DIAGNOSIS — R300 Dysuria: Secondary | ICD-10-CM

## 2021-05-01 DIAGNOSIS — K5901 Slow transit constipation: Secondary | ICD-10-CM

## 2021-05-01 DIAGNOSIS — Z789 Other specified health status: Secondary | ICD-10-CM

## 2021-05-01 LAB — COMPREHENSIVE METABOLIC PANEL
ALT: 23 U/L (ref 0–35)
AST: 21 U/L (ref 0–37)
Albumin: 4.7 g/dL (ref 3.5–5.2)
Alkaline Phosphatase: 48 U/L (ref 39–117)
BUN: 9 mg/dL (ref 6–23)
CO2: 24 mEq/L (ref 19–32)
Calcium: 9.7 mg/dL (ref 8.4–10.5)
Chloride: 104 mEq/L (ref 96–112)
Creatinine, Ser: 0.69 mg/dL (ref 0.40–1.20)
GFR: 107.86 mL/min (ref >60.00)
Glucose, Bld: 81 mg/dL (ref 70–99)
Potassium: 4.3 mEq/L (ref 3.5–5.1)
Sodium: 137 mEq/L (ref 135–145)
Total Bilirubin: 0.4 mg/dL (ref 0.2–1.2)
Total Protein: 7.6 g/dL (ref 6.0–8.3)

## 2021-05-01 LAB — CBC WITH DIFFERENTIAL/PLATELET
Basophils Absolute: 0.1 10*3/uL (ref 0.0–0.1)
Basophils Relative: 0.8 % (ref 0.0–3.0)
Eosinophils Absolute: 0.1 10*3/uL (ref 0.0–0.7)
Eosinophils Relative: 1.9 % (ref 0.0–5.0)
HCT: 41.3 % (ref 36.0–46.0)
Hemoglobin: 13.8 g/dL (ref 12.0–15.0)
Lymphocytes Relative: 33.4 % (ref 12.0–46.0)
Lymphs Abs: 2.3 10*3/uL (ref 0.7–4.0)
MCHC: 33.4 g/dL (ref 30.0–36.0)
MCV: 85 fl (ref 78.0–100.0)
Monocytes Absolute: 0.5 10*3/uL (ref 0.1–1.0)
Monocytes Relative: 6.9 % (ref 3.0–12.0)
Neutro Abs: 3.9 10*3/uL (ref 1.4–7.7)
Neutrophils Relative %: 57 % (ref 43.0–77.0)
Platelets: 220 10*3/uL (ref 150.0–400.0)
RBC: 4.86 Mil/uL (ref 3.87–5.11)
RDW: 12.9 % (ref 11.5–15.5)
WBC: 6.8 10*3/uL (ref 4.0–10.5)

## 2021-05-01 LAB — LDL CHOLESTEROL, DIRECT: Direct LDL: 162 mg/dL

## 2021-05-01 LAB — HEMOGLOBIN A1C: Hgb A1c MFr Bld: 5.3 % (ref 4.6–6.5)

## 2021-05-01 LAB — LIPID PANEL
Cholesterol: 238 mg/dL — ABNORMAL HIGH (ref 0–200)
HDL: 50.6 mg/dL (ref >39.00)
NonHDL: 187.16
Total CHOL/HDL Ratio: 5
Triglycerides: 273 mg/dL — ABNORMAL HIGH (ref 0.0–149.0)
VLDL: 54.6 mg/dL — ABNORMAL HIGH (ref 0.0–40.0)

## 2021-05-01 LAB — C-REACTIVE PROTEIN: CRP: <1 mg/dL (ref 0.5–20.0)

## 2021-05-01 LAB — VITAMIN D 25 HYDROXY (VIT D DEFICIENCY, FRACTURES): VITD: 52.39 ng/mL (ref 30.00–100.00)

## 2021-05-01 LAB — TSH: TSH: 3.18 u[IU]/mL (ref 0.35–5.50)

## 2021-05-01 LAB — T4, FREE: Free T4: 1.07 ng/dL (ref 0.60–1.60)

## 2021-05-01 NOTE — Progress Notes (Signed)
This visit occurred during the SARS-CoV-2 public health emergency.  Safety protocols were in place, including screening questions prior to the visit, additional usage of staff PPE, and extensive cleaning of exam room while observing appropriate contact time as indicated for disinfecting solutions.    Patient ID: Sonya Clayton, female  DOB: 1980/06/15, 41 y.o.   MRN: [id number] Patient Care Team    Relationship Specialty Notifications Start End  Ma Hillock, DO PCP - General Family Medicine  11/20/17   Salvadore Dom, MD Consulting Physician Obstetrics and Gynecology  01/03/20     Chief Complaint  Patient presents with   Annual Exam    Pt is not fasting    Subjective: Sonya Clayton is a 41 y.o.  Female  present for CPE./Acute concerns All past medical history, surgical history, allergies, family history, immunizations, medications and social history were updated in the electronic medical record today. All recent labs, ED visits and hospitalizations within the last year were reviewed.  Health maintenance:  Colonoscopy: no fhx. Routine screen at 44 Mammogram: 04/19/2021- bcgso. PGM with breast cancer.  Cervical cancer screening: last pap: 2020 per pt. No prior abnormal. Uncertain if co-test preformed. She has a IUD that will need removed next year. No LMP recorded. (Menstrual status: IUD). Immunizations: tdap 2016 UTD, Influenza declined (today). COVID series completed. Infectious disease screening: HIV completed 2016, HEP c completed today DEXA:routine screen. Assistive device: none Oxygen [id number] Patient has a Dental home. Hospitalizations/ED visits: reviewed  Abd discomfort/dysuria/constipation/bloating: Patient's last menstrual period was 04/11/2021. Reports she has been having more frequent constipation, abdominal pain, and burning with urination as well as bloating over the last couple months.  Some of this was occurring around the time of her menstrual cycle, but  not every time.  She has changed her diet and feels the bloating and constipation has improved.  She still noticing some dysuria.  No family history of colon cancer or IBD.  Patient has never had a colonoscopy as of yet at 64.  She denies fever, chills, diarrhea or blood per rectum.  Depression screen [facility] 2/9 05/01/2021 03/12/2021 12/10/2020 01/03/2020 01/31/2019  Decreased Interest 0 0 0 0 0  Down, Depressed, Hopeless 0 0 0 0 0  PHQ - 2 Score 0 0 0 0 0   GAD 7 : Generalized Anxiety Score 05/01/2021  Nervous, Anxious, on Edge 0  Control/stop worrying 0  Worry too much - different things 0  Trouble relaxing 0  Restless 0  Easily annoyed or irritable 0  Afraid - awful might happen 0  Total GAD 7 Score 0  Anxiety Difficulty Not difficult at all   Immunization History  Administered Date(s) Administered   Influenza,inj,Quad PF,6+ Mos 03/05/2018, 12/15/2018, 01/03/2020   PFIZER(Purple Top)SARS-COV-2 Vaccination 07/26/2019, 08/16/2019, 03/04/2020   Tdap 04/14/2014   Past Medical History:  Diagnosis Date   Chicken pox    GERD (gastroesophageal reflux disease)    Gestational diabetes    Infertility, female    Thyroid disease    Vegetarian diet    Allergies  Allergen Reactions   Penicillins Hives   Past Surgical History:  Procedure Laterality Date   NO PAST SURGERIES     WISDOM TOOTH EXTRACTION     Family History  Problem Relation Age of Onset   Hypertension Mother    Hypertension Father    Breast cancer Paternal Grandmother    Diabetes Paternal Grandmother    Hypertension Paternal Grandmother    Hypertension Paternal Merchant [employer] officer  Social History   Social History Narrative   Marital status/children/pets: married, 2 children   Education/employment: B.S. Electrical eng.    Safety:      -Wears a bicycle helmet riding a bike: No     -smoke alarm in the home:Yes     - wears seatbelt: Yes     - Feels safe in their relationships: Yes    Allergies as of 05/01/2021        Reactions   Penicillins Hives        Medication List        Accurate as of May 01, 2021 11:59 PM. If you have any questions, ask your nurse or doctor.          STOP taking these medications    famotidine 20 MG tablet Commonly known as: Pepcid Stopped by: Howard Pouch, DO       TAKE these medications    Biotin 10000 MCG Tabs Take 1 tablet by mouth daily.   fluticasone 50 MCG/ACT nasal spray Commonly known as: FLONASE Place 2 sprays into both nostrils daily.   levocetirizine 5 MG tablet Commonly known as: XYZAL Take 1 tablet (5 mg total) by mouth every evening.   multivitamin capsule Take 1 capsule by mouth daily.   pantoprazole 40 MG tablet Commonly known as: PROTONIX Take 1 tablet (40 mg total) by mouth daily.   Vitamin D3 50 MCG (2000 UT) Tabs Take 1 tablet by mouth daily.        All past medical history, surgical history, allergies, family history, immunizations andmedications were updated in the EMR today and reviewed under the history and medication portions of their EMR.     Recent Results (from the past 2160 hour(s))  CBC with Differential/Platelet     Status: None   Collection Time: 05/01/21 12:03 PM  Result Value Ref Range   WBC 6.8 4.0 - 10.5 K/uL   RBC 4.86 3.87 - 5.11 Mil/uL   Hemoglobin 13.8 12.0 - 15.0 g/dL   HCT 41.3 36.0 - 46.0 %   MCV 85.0 78.0 - 100.0 fl   MCHC 33.4 30.0 - 36.0 g/dL   RDW 12.9 11.5 - 15.5 %   Platelets 220.0 150.0 - 400.0 K/uL   Neutrophils Relative % 57.0 43.0 - 77.0 %   Lymphocytes Relative 33.4 12.0 - 46.0 %   Monocytes Relative 6.9 3.0 - 12.0 %   Eosinophils Relative 1.9 0.0 - 5.0 %   Basophils Relative 0.8 0.0 - 3.0 %   Neutro Abs 3.9 1.4 - 7.7 K/uL   Lymphs Abs 2.3 0.7 - 4.0 K/uL   Monocytes Absolute 0.5 0.1 - 1.0 K/uL   Eosinophils Absolute 0.1 0.0 - 0.7 K/uL   Basophils Absolute 0.1 0.0 - 0.1 K/uL  Comprehensive metabolic panel     Status: None   Collection Time: 05/01/21 12:03 PM  Result Value  Ref Range   Sodium 137 135 - 145 mEq/L   Potassium 4.3 3.5 - 5.1 mEq/L   Chloride 104 96 - 112 mEq/L   CO2 24 19 - 32 mEq/L   Glucose, Bld 81 70 - 99 mg/dL   BUN 9 6 - 23 mg/dL   Creatinine, Ser 0.69 0.40 - 1.20 mg/dL   Total Bilirubin 0.4 0.2 - 1.2 mg/dL   Alkaline Phosphatase 48 39 - 117 U/L   AST 21 0 - 37 U/L   ALT 23 0 - 35 U/L   Total Protein 7.6 6.0 - 8.3 g/dL  Albumin 4.7 3.5 - 5.2 g/dL   GFR 107.86 >60.00 mL/min    Comment: Calculated using the CKD-EPI Creatinine Equation (2021)   Calcium 9.7 8.4 - 10.5 mg/dL  Hemoglobin A1c     Status: None   Collection Time: 05/01/21 12:03 PM  Result Value Ref Range   Hgb A1c MFr Bld 5.3 4.6 - 6.5 %    Comment: Glycemic Control Guidelines for People with Diabetes:Non Diabetic:  <6%Goal of Therapy: <7%Additional Action Suggested:  >8%   Lipid panel     Status: Abnormal   Collection Time: 05/01/21 12:03 PM  Result Value Ref Range   Cholesterol 238 (H) 0 - 200 mg/dL    Comment: ATP III Classification       Desirable:  < 200 mg/dL               Borderline High:  200 - 239 mg/dL          High:  > = 240 mg/dL   Triglycerides 273.0 (H) 0.0 - 149.0 mg/dL    Comment: Normal:  <150 mg/dLBorderline High:  150 - 199 mg/dL   HDL 50.60 >39.00 mg/dL   VLDL 54.6 (H) 0.0 - 40.0 mg/dL   Total CHOL/HDL Ratio 5     Comment:                Men          Women1/2 Average Risk     3.4          3.3Average Risk          5.0          4.42X Average Risk          9.6          7.13X Average Risk          15.0          11.0                       NonHDL 187.16     Comment: NOTE:  Non-HDL goal should be 30 mg/dL higher than patient's LDL goal (i.e. LDL goal of < 70 mg/dL, would have non-HDL goal of < 100 mg/dL)  TSH     Status: None   Collection Time: 05/01/21 12:03 PM  Result Value Ref Range   TSH 3.18 0.35 - 5.50 uIU/mL  Iron, TIBC and Ferritin Panel     Status: None   Collection Time: 05/01/21 12:03 PM  Result Value Ref Range   Iron 66 40 - 190 mcg/dL   TIBC  356 250 - 450 mcg/dL (calc)   %SAT 19 16 - 45 % (calc)   Ferritin 44 16 - 232 ng/mL  T4, free     Status: None   Collection Time: 05/01/21 12:03 PM  Result Value Ref Range   Free T4 1.07 0.60 - 1.60 ng/dL    Comment: Specimens from patients who are undergoing biotin therapy and /or ingesting biotin supplements may contain high levels of biotin.  The higher biotin concentration in these specimens interferes with this Free T4 assay.  Specimens that contain high levels  of biotin may cause false high results for this Free T4 assay.  Please interpret results in light of the total clinical presentation of the patient.    Vitamin D (25 hydroxy)     Status: None   Collection Time: 05/01/21 12:03 PM  Result Value Ref Range   VITD 52.39 30.00 -  100.00 ng/mL  C-reactive protein     Status: None   Collection Time: 05/01/21 12:03 PM  Result Value Ref Range   CRP <1.0 0.5 - 20.0 mg/dL  LDL cholesterol, direct     Status: None   Collection Time: 05/01/21 12:03 PM  Result Value Ref Range   Direct LDL 162.0 mg/dL    Comment: Optimal:  <161<100 mg/dLNear or Above Optimal:  100-129 mg/dLBorderline High:  130-159 mg/dLHigh:  160-189 mg/dLVery High:  >190 mg/dL    MM 3D SCREEN BREAST BILATERAL  Result Date: 04/19/2021 CLINICAL DATA:  Screening. EXAM: DIGITAL SCREENING BILATERAL MAMMOGRAM WITH TOMOSYNTHESIS AND CAD TECHNIQUE: Bilateral screening digital craniocaudal and mediolateral oblique mammograms were obtained. Bilateral screening digital breast tomosynthesis was performed. The images were evaluated with computer-aided detection. COMPARISON:  Previous exam(s). ACR Breast Density Category c: The breast tissue is heterogeneously dense, which may obscure small masses. FINDINGS: There are no findings suspicious for malignancy. IMPRESSION: No mammographic evidence of malignancy. A result letter of this screening mammogram will be mailed directly to the patient. RECOMMENDATION: Screening mammogram in one year.  (Code:SM-B-01Y) BI-RADS CATEGORY  1: Negative. Electronically Signed   By: Emmaline KluverNancy  Ballantyne M.D.   On: 04/19/2021 14:11   ROS 14 pt review of systems performed and negative (unless mentioned in an HPI)  Objective:  BP 108/65    Pulse 63    Temp 98.1 F (36.7 C)    Ht 5' 2.6" (1.59 m)    Wt 145 lb 6.4 oz (66 kg)    LMP 04/11/2021    SpO2 100%    BMI 26.09 kg/m  Physical Exam Vitals and nursing note reviewed.  Constitutional:      General: She is not in acute distress.    Appearance: Normal appearance. She is not ill-appearing or toxic-appearing.  HENT:     Head: Normocephalic and atraumatic.     Right Ear: Tympanic membrane, ear canal and external ear normal. There is no impacted cerumen.     Left Ear: Tympanic membrane, ear canal and external ear normal. There is no impacted cerumen.     Nose: No congestion or rhinorrhea.     Mouth/Throat:     Mouth: Mucous membranes are moist.     Pharynx: Oropharynx is clear. No oropharyngeal exudate or posterior oropharyngeal erythema.  Eyes:     General: No scleral icterus.       Right eye: No discharge.        Left eye: No discharge.     Extraocular Movements: Extraocular movements intact.     Conjunctiva/sclera: Conjunctivae normal.     Pupils: Pupils are equal, round, and reactive to light.  Cardiovascular:     Rate and Rhythm: Normal rate and regular rhythm.     Pulses: Normal pulses.     Heart sounds: Normal heart sounds. No murmur heard.   No friction rub. No gallop.  Pulmonary:     Effort: Pulmonary effort is normal. No respiratory distress.     Breath sounds: Normal breath sounds. No stridor. No wheezing, rhonchi or rales.  Chest:     Chest wall: No tenderness.  Abdominal:     General: Abdomen is flat. Bowel sounds are normal. There is no distension.     Palpations: Abdomen is soft. There is no mass.     Tenderness: There is abdominal tenderness. There is no right CVA tenderness, left CVA tenderness, guarding or rebound.      Hernia: No hernia is present.  Comments: Mild diffuse TTP  Musculoskeletal:        General: No swelling, tenderness or deformity. Normal range of motion.     Cervical back: Normal range of motion and neck supple. No rigidity or tenderness.     Right lower leg: No edema.     Left lower leg: No edema.  Lymphadenopathy:     Cervical: No cervical adenopathy.  Skin:    General: Skin is warm and dry.     Coloration: Skin is not jaundiced or pale.     Findings: No bruising, erythema, lesion or rash.  Neurological:     General: No focal deficit present.     Mental Status: She is alert and oriented to person, place, and time. Mental status is at baseline.     Cranial Nerves: No cranial nerve deficit.     Sensory: No sensory deficit.     Motor: No weakness.     Coordination: Coordination normal.     Gait: Gait normal.     Deep Tendon Reflexes: Reflexes normal.  Psychiatric:        Mood and Affect: Mood normal.        Behavior: Behavior normal.        Thought Content: Thought content normal.        Judgment: Judgment normal.     No results found.  Assessment/plan: Shondrea Trim is a 41 y.o. female present for CPE (non-fasting labs)/acute concerns Abnormal TSH - TSH - T4, free Iron deficiency/Vegetarian diet/Vitamin D deficiency Currently taking iron 3 times a week.  Tolerating well. - CBC with Differential/Platelet - Iron, TIBC and Ferritin Panel - Vitamin D (25 hydroxy) Diabetes mellitus screening - Hemoglobin A1c Encounter for hepatitis C screening test for low risk patient - Hepatitis C antibody Lipid screening - Lipid panel  Slow transit constipation/Generalized abdominal pain/Dysuria She is diffusely tender on exam of her abdomen.  Even to light palpation. - C-reactive protein - Urinalysis w microscopic + reflex cultur - CMP -Encouraged her to start Senokot nightly.  Ensure she is hydrating adequately.  We will wait on lab results and consider further plan.  May  need to consider GI referral versus CT abdomen if symptoms are not improving with bowel regimen.   Encounter for general adult medical examination with abnormal findings Colonoscopy: no fhx. Routine screen at 44 Mammogram: 04/19/2021- bcgso. PGM with breast cancer.  Cervical cancer screening: last pap: 2020 per pt. No prior abnormal. Uncertain if co-test preformed. She has a IUD that will need removed next year. No LMP recorded. (Menstrual status: IUD). Immunizations: tdap 2016 UTD, Influenza declined (today). COVID series completed. Infectious disease screening: HIV completed 2016, HEP c completed today DEXA:routine screen. Patient was encouraged to exercise greater than 150 minutes a week. Patient was encouraged to choose a diet filled with fresh fruits and vegetables, and lean meats. AVS provided to patient today for education/recommendation on gender specific health and safety maintenance.  Return in about 1 year (around 05/02/2022) for CPE (30 min).  Orders Placed This Encounter  Procedures   CBC with Differential/Platelet   Comprehensive metabolic panel   Hemoglobin A1c   Lipid panel   TSH   Iron, TIBC and Ferritin Panel   T4, free   Hepatitis C antibody   Vitamin D (25 hydroxy)   C-reactive protein   Urinalysis w microscopic + reflex cultur   LDL cholesterol, direct   No orders of the defined types were placed in this encounter.  Referral Orders  No referral(s) requested today     Electronically signed by: Howard Pouch, [place]

## 2021-05-01 NOTE — Patient Instructions (Addendum)
Senokot before bed if you notice bloating or constipation.    Health Maintenance, Female Adopting a healthy lifestyle and getting preventive care are important in promoting health and wellness. Ask your health care provider about: The right schedule for you to have regular tests and exams. Things you can do on your own to prevent diseases and keep yourself healthy. What should I know about diet, weight, and exercise? Eat a healthy diet  Eat a diet that includes plenty of vegetables, fruits, low-fat dairy products, and lean protein. Do not eat a lot of foods that are high in solid fats, added sugars, or sodium. Maintain a healthy weight Body mass index (BMI) is used to identify weight problems. It estimates body fat based on height and weight. Your health care provider can help determine your BMI and help you achieve or maintain a healthy weight. Get regular exercise Get regular exercise. This is one of the most important things you can do for your health. Most adults should: Exercise for at least 150 minutes each week. The exercise should increase your heart rate and make you sweat (moderate-intensity exercise). Do strengthening exercises at least twice a week. This is in addition to the moderate-intensity exercise. Spend less time sitting. Even light physical activity can be beneficial. Watch cholesterol and blood lipids Have your blood tested for lipids and cholesterol at 42 years of age, then have this test every 5 years. Have your cholesterol levels checked more often if: Your lipid or cholesterol levels are high. You are older than 42 years of age. You are at high risk for heart disease. What should I know about cancer screening? Depending on your health history and family history, you may need to have cancer screening at various ages. This may include screening for: Breast cancer. Cervical cancer. Colorectal cancer. Skin cancer. Lung cancer. What should I know about heart  disease, diabetes, and high blood pressure? Blood pressure and heart disease High blood pressure causes heart disease and increases the risk of stroke. This is more likely to develop in people who have high blood pressure readings or are overweight. Have your blood pressure checked: Every 3-5 years if you are 30-41 years of age. Every year if you are 7 years old or older. Diabetes Have regular diabetes screenings. This checks your fasting blood sugar level. Have the screening done: Once every three years after age 55 if you are at a normal weight and have a low risk for diabetes. More often and at a younger age if you are overweight or have a high risk for diabetes. What should I know about preventing infection? Hepatitis B If you have a higher risk for hepatitis B, you should be screened for this virus. Talk with your health care provider to find out if you are at risk for hepatitis B infection. Hepatitis C Testing is recommended for: Everyone born from [birth year] through 1966. Anyone with known risk factors for hepatitis C. Sexually transmitted infections (STIs) Get screened for STIs, including gonorrhea and chlamydia, if: You are sexually active and are younger than 42 years of age. You are older than 42 years of age and your health care provider tells you that you are at risk for this type of infection. Your sexual activity has changed since you were last screened, and you are at increased risk for chlamydia or gonorrhea. Ask your health care provider if you are at risk. Ask your health care provider about whether you are at high risk for HIV. Your  health care provider may recommend a prescription medicine to help prevent HIV infection. If you choose to take medicine to prevent HIV, you should first get tested for HIV. You should then be tested every 3 months for as long as you are taking the medicine. Pregnancy If you are about to stop having your period (premenopausal) and you may become  pregnant, seek counseling before you get pregnant. Take 400 to 800 micrograms (mcg) of folic acid every day if you become pregnant. Ask for birth control (contraception) if you want to prevent pregnancy. Osteoporosis and menopause Osteoporosis is a disease in which the bones lose minerals and strength with aging. This can result in bone fractures. If you are 4 years old or older, or if you are at risk for osteoporosis and fractures, ask your health care provider if you should: Be screened for bone loss. Take a calcium or vitamin D supplement to lower your risk of fractures. Be given hormone replacement therapy (HRT) to treat symptoms of menopause. Follow these instructions at home: Alcohol use Do not drink alcohol if: Your health care provider tells you not to drink. You are pregnant, may be pregnant, or are planning to become pregnant. If you drink alcohol: Limit how much you have to: 0-1 drink a day. Know how much alcohol is in your drink. In the [place] one drink equals one 12 oz bottle of beer (355 mL), one 5 oz glass of wine (148 mL), or one 1 oz glass of hard liquor (44 mL). Lifestyle Do not use any products that contain nicotine or tobacco. These products include cigarettes, chewing tobacco, and vaping devices, such as e-cigarettes. If you need help quitting, ask your health care provider. Do not use street drugs. Do not share needles. Ask your health care provider for help if you need support or information about quitting drugs. General instructions Schedule regular health, dental, and eye exams. Stay current with your vaccines. Tell your health care provider if: You often feel depressed. You have ever been abused or do not feel safe at home. Summary Adopting a healthy lifestyle and getting preventive care are important in promoting health and wellness. Follow your health care provider's instructions about healthy diet, exercising, and getting tested or screened for  diseases. Follow your health care provider's instructions on monitoring your cholesterol and blood pressure. This information is not intended to replace advice given to you by your health care provider. Make sure you discuss any questions you have with your health care provider. Document Revised: 08/20/2020 Document Reviewed: 08/20/2020 Elsevier Patient Education  2022 [employer].

## 2021-05-02 LAB — HEPATITIS C ANTIBODY
Hepatitis C Ab: NONREACTIVE
SIGNAL TO CUT-OFF: 0.09 (ref <1.00)

## 2021-05-02 LAB — IRON,TIBC AND FERRITIN PANEL
%SAT: 19 % (calc) (ref 16–45)
Ferritin: 44 ng/mL (ref 16–232)
Iron: 66 ug/dL (ref 40–190)
TIBC: 356 mcg/dL (calc) (ref 250–450)

## 2021-05-06 ENCOUNTER — Other Ambulatory Visit: Payer: Self-pay

## 2021-05-06 ENCOUNTER — Ambulatory Visit: Payer: 59

## 2021-05-06 DIAGNOSIS — R300 Dysuria: Secondary | ICD-10-CM

## 2021-05-06 NOTE — Progress Notes (Signed)
Pt came for urine drop off

## 2021-05-07 ENCOUNTER — Telehealth: Payer: Self-pay | Admitting: Family Medicine

## 2021-05-07 LAB — URINALYSIS W MICROSCOPIC + REFLEX CULTURE
Bacteria, UA: NONE SEEN /HPF
Bilirubin Urine: NEGATIVE
Glucose, UA: NEGATIVE
Hgb urine dipstick: NEGATIVE
Hyaline Cast: NONE SEEN /LPF
Ketones, ur: NEGATIVE
Leukocyte Esterase: NEGATIVE
Nitrites, Initial: NEGATIVE
Protein, ur: NEGATIVE
RBC / HPF: NONE SEEN /HPF (ref 0–2)
Specific Gravity, Urine: 1.013 (ref 1.001–1.035)
WBC, UA: NONE SEEN /HPF (ref 0–5)
pH: 5.5 (ref 5.0–8.0)

## 2021-05-07 LAB — NO CULTURE INDICATED

## 2021-05-07 NOTE — Telephone Encounter (Signed)
Please inform patient: Her urine results are normal without any signs of infection or blood in her urine. Her labs collected by blood were all normal not suggesting any inflammatory or infectious cause of her abdominal discomfort.  I would encourage her to start the Senokot nightly as we discussed, and follow-up in 2-4 weeks with this provider.  If symptoms are worsening follow-up sooner.

## 2021-05-08 NOTE — Telephone Encounter (Signed)
Spoke with pt regarding labs and instructions.   

## 2021-06-07 ENCOUNTER — Other Ambulatory Visit: Payer: Self-pay | Admitting: Family Medicine

## 2021-06-12 ENCOUNTER — Encounter: Payer: Self-pay | Admitting: Family Medicine

## 2021-06-12 ENCOUNTER — Telehealth (INDEPENDENT_AMBULATORY_CARE_PROVIDER_SITE_OTHER): Payer: 59 | Admitting: Family Medicine

## 2021-06-12 ENCOUNTER — Other Ambulatory Visit: Payer: Self-pay

## 2021-06-12 DIAGNOSIS — B9689 Other specified bacterial agents as the cause of diseases classified elsewhere: Secondary | ICD-10-CM

## 2021-06-12 DIAGNOSIS — J329 Chronic sinusitis, unspecified: Secondary | ICD-10-CM

## 2021-06-12 MED ORDER — PREDNISONE 20 MG PO TABS: ORAL_TABLET | ORAL | 0 refills | Status: DC

## 2021-06-12 MED ORDER — DOXYCYCLINE HYCLATE 100 MG PO TABS: 100.0000 mg | ORAL_TABLET | Freq: Two times a day (BID) | ORAL | 0 refills | Status: DC

## 2021-06-12 NOTE — Progress Notes (Signed)
? ? ? ? ?VIRTUAL VISIT VIA VIDEO ? ?I connected with Sonya Clayton on 06/12/21 at  8:30 AM EST by a video enabled telemedicine application and verified that I am speaking with the correct person using two identifiers. ?Location patient: Home ?Location provider: [facility], Office ?Persons participating in the virtual visit: Patient, Dr. Claiborne Billings and Les Pou, CMA ? ?I discussed the limitations of evaluation and management by telemedicine and the availability of in person appointments. The patient expressed understanding and agreed to proceed. ? ? ? ?Sonya Clayton , 30-May-1980, 41 y.o., female ?MRN: [id number] ?Patient Care Team  ?  Relationship Specialty Notifications Start End  ?Natalia Leatherwood, DO PCP - General Family Medicine  11/20/17   ?Romualdo Bolk, MD Consulting Physician Obstetrics and Gynecology  01/03/20   ? ? ?Chief Complaint  ?Patient presents with  ? Covid Positive  ?  Pt c/o dry cough, post nasal drip, facial pain x 1 mo; pt tested pos 05/22/21  ? ?  ?Subjective: Pt presents for an OV with complaints of persistent nonproductive dry cough and postnasal drip since her COVID infection 05/22/2021.  She denies any continued fevers chills, shortness of breath or wheezing.  She is taking Flonase daily and antihistamine.  She has continued her next 40 mg daily. ?She was not treated with antiviral for her COVID. ?She does not have a history of reactive airway. ? ?Depression screen [facility] 2/9 05/01/2021 03/12/2021 12/10/2020 01/03/2020 01/31/2019  ?Decreased Interest 0 0 0 0 0  ?Down, Depressed, Hopeless 0 0 0 0 0  ?PHQ - 2 Score 0 0 0 0 0  ? ? ?Allergies  ?Allergen Reactions  ? Penicillins Hives  ? ?Social History  ? ?Social History Narrative  ? Marital status/children/pets: married, 2 children  ? Education/employment: B.S. Investment banker, corporate.   ? Safety:   ?   -Wears a bicycle helmet riding a bike: No  ?   -smoke alarm in the home:Yes  ?   - wears seatbelt: Yes  ?   - Feels safe in their relationships: Yes   ? ?Past Medical History:  ?Diagnosis Date  ? Chicken pox   ? GERD (gastroesophageal reflux disease)   ? Gestational diabetes   ? Infertility, female   ? Thyroid disease   ? Vegetarian diet   ? ?Past Surgical History:  ?Procedure Laterality Date  ? NO PAST SURGERIES    ? WISDOM TOOTH EXTRACTION    ? ?Family History  ?Problem Relation Age of Onset  ? Hypertension Mother   ? Hypertension Father   ? Breast cancer Paternal Grandmother   ? Diabetes Paternal Grandmother   ? Hypertension Paternal Grandmother   ? Hypertension Paternal Grandfather   ? ?Allergies as of 06/12/2021   ? ?   Reactions  ? Penicillins Hives  ? ?  ? ?  ?Medication List  ?  ? ?  ? Accurate as of June 12, 2021 10:02 AM. If you have any questions, ask your nurse or doctor.  ?  ?  ? ?  ? ?Biotin 70786 MCG Tabs ?Take 1 tablet by mouth daily. ?  ?doxycycline 100 MG tablet ?Commonly known as: VIBRA-TABS ?Take 1 tablet (100 mg total) by mouth 2 (two) times daily. ?Started by: Felix Pacini, DO ?  ?fluticasone 50 MCG/ACT nasal spray ?Commonly known as: FLONASE ?SPRAY 2 SPRAYS INTO EACH NOSTRIL EVERY DAY ?  ?levocetirizine 5 MG tablet ?Commonly known as: XYZAL ?Take 1 tablet (5 mg total) by mouth every  evening. ?  ?multivitamin capsule ?Take 1 capsule by mouth daily. ?  ?pantoprazole 40 MG tablet ?Commonly known as: PROTONIX ?Take 1 tablet (40 mg total) by mouth daily. ?  ?predniSONE 20 MG tablet ?Commonly known as: DELTASONE ?60 mg x3d, 40 mg x3d, 20 mg x2d, 10 mg x2d ?Started by: Felix Pacini, DO ?  ?Vitamin D3 50 MCG (2000 UT) Tabs ?Take 1 tablet by mouth daily. ?  ? ?  ? ? ?All past medical history, surgical history, allergies, family history, immunizations andmedications were updated in the EMR today and reviewed under the history and medication portions of their EMR.    ? ?Review of Systems  ?Constitutional:  Positive for fever. Negative for chills and malaise/fatigue.  ?HENT:  Positive for congestion, sinus pain and sore throat. Negative for ear discharge  and ear pain.   ?Eyes:  Negative for pain and discharge.  ?Respiratory:  Positive for cough. Negative for hemoptysis, sputum production, shortness of breath and wheezing.   ?Skin:  Negative for rash.  ?Neurological:  Positive for headaches.  ?Negative, with the exception of above mentioned in HPI ? ? ?Objective:  ?There were no vitals taken for this visit. ?There is no height or weight on file to calculate BMI. ?Physical Exam ?Vitals and nursing note reviewed.  ?Constitutional:   ?   General: She is not in acute distress. ?   Appearance: Normal appearance. She is normal weight. She is not ill-appearing or toxic-appearing.  ?HENT:  ?   Nose: Congestion present.  ?Eyes:  ?   Extraocular Movements: Extraocular movements intact.  ?   Conjunctiva/sclera: Conjunctivae normal.  ?   Pupils: Pupils are equal, round, and reactive to light.  ?Pulmonary:  ?   Effort: Pulmonary effort is normal.  ?   Comments: Cough  present ?Skin: ?   Findings: No rash.  ?Neurological:  ?   Mental Status: She is alert and oriented to person, place, and time. Mental status is at baseline.  ?Psychiatric:     ?   Mood and Affect: Mood normal.     ?   Behavior: Behavior normal.     ?   Thought Content: Thought content normal.     ?   Judgment: Judgment normal.  ? ? ?No results found. ?No results found. ?No results found for this or any previous visit (from the past 24 hour(s)). ? ?Assessment/Plan: ?Sonya Clayton is a 41 y.o. female present for OV for  ?Bacterial sinusitis/cough ?Rest, hydrate.  ?+/- flonase, mucinex (DM if cough), nettie pot or nasal saline.  ?Doxy and pred prescribed, take until completed.  ?If cough present it can last up to 6-8 weeks.  ?Discussed OTC tx for cough.  ?F/U 2 weeks if not improved.  ? ? ? ?Reviewed expectations re: course of current medical issues. ?Discussed self-management of symptoms. ?Outlined signs and symptoms indicating need for more acute intervention. ?Patient verbalized understanding and all questions  were answered. ?Patient received an After-Visit Summary. ? ? ? ?No orders of the defined types were placed in this encounter. ? ?Meds ordered this encounter  ?Medications  ? doxycycline (VIBRA-TABS) 100 MG tablet  ?  Sig: Take 1 tablet (100 mg total) by mouth 2 (two) times daily.  ?  Dispense:  20 tablet  ?  Refill:  0  ? predniSONE (DELTASONE) 20 MG tablet  ?  Sig: 60 mg x3d, 40 mg x3d, 20 mg x2d, 10 mg x2d  ?  Dispense:  18 tablet  ?  Refill:  0  ? ?Referral Orders  ?No referral(s) requested today  ? ? ? ?Note is dictated utilizing voice recognition software. Although note has been proof read prior to signing, occasional typographical errors still can be missed. If any questions arise, please do not hesitate to call for verification.  ? ?electronically signed by: ? ?Felix Pacini, DO  ?[facility] [place] ? ? ? ?

## 2021-06-12 NOTE — Patient Instructions (Signed)

## 2021-06-19 ENCOUNTER — Other Ambulatory Visit: Payer: Self-pay | Admitting: Family Medicine

## 2021-07-04 ENCOUNTER — Other Ambulatory Visit: Payer: Self-pay | Admitting: Family Medicine

## 2021-09-17 ENCOUNTER — Other Ambulatory Visit: Payer: Self-pay | Admitting: Family Medicine

## 2021-09-21 ENCOUNTER — Telehealth: Payer: Self-pay | Admitting: Family Medicine

## 2021-09-23 NOTE — Telephone Encounter (Signed)
Refill sent.

## 2021-09-23 NOTE — Telephone Encounter (Signed)
Patient states pharmacy told her that Dr. Claiborne Billings declined refill.  Please advise if she needs an appt. Follow up note in January (CPE) states she is not due back until Jan 2024.  Pantoprazole 40mg   [facility]

## 2021-09-23 NOTE — Telephone Encounter (Signed)
Pt advised refill sent. °

## 2021-11-01 ENCOUNTER — Telehealth (INDEPENDENT_AMBULATORY_CARE_PROVIDER_SITE_OTHER): Payer: 59 | Admitting: Family Medicine

## 2021-11-01 VITALS — Wt 145.0 lb

## 2021-11-01 DIAGNOSIS — B9689 Other specified bacterial agents as the cause of diseases classified elsewhere: Secondary | ICD-10-CM

## 2021-11-01 DIAGNOSIS — J329 Chronic sinusitis, unspecified: Secondary | ICD-10-CM

## 2021-11-01 MED ORDER — DOXYCYCLINE HYCLATE 100 MG PO TABS: 100.0000 mg | ORAL_TABLET | Freq: Two times a day (BID) | ORAL | 0 refills | Status: DC

## 2021-11-01 NOTE — Progress Notes (Signed)
VIRTUAL VISIT VIA VIDEO  I connected with Sonya Clayton on 11/01/21 at  4:00 PM EDT by a video enabled telemedicine application and verified that I am speaking with the correct person using two identifiers. Location patient: Home Location provider: [facility], Office Persons participating in the virtual visit: Patient, Dr. Claiborne Billings and Les Pou, CMA  I discussed the limitations of evaluation and management by telemedicine and the availability of in person appointments. The patient expressed understanding and agreed to proceed.     Sonya Clayton , [birth year], 41 y.o., female MRN: [id number] Patient Care Team    Relationship Specialty Notifications Start End  Natalia Leatherwood, DO PCP - General Family Medicine  11/20/17   Romualdo Bolk, MD Consulting Physician Obstetrics and Gynecology  01/03/20     Chief Complaint  Patient presents with   Sore Throat    Started 2 weeks started getting worse in the last 7 days      Subjective: Pt presents for an OV with complaints of sore throat that has been intermittent over the last 2 to 3 weeks.  She states over the last 7 days it has been frequent.  She has noticed sinus pressure pain and ear pain.  She denies fever, chills, nausea or vomit.  She denies rhinorrhea, but endorses nasal congestion.  She denies productive cough but states she has had more of a tickle cough.  She has continued her Protonix 40 mg daily.  She states this feels different than when she was having her reflux.     05/01/2021   10:35 AM 03/12/2021    1:33 PM 12/10/2020    1:38 PM 01/03/2020    8:35 AM 01/31/2019    2:40 PM  Depression screen PHQ 2/9  Decreased Interest 0 0 0 0 0  Down, Depressed, Hopeless 0 0 0 0 0  PHQ - 2 Score 0 0 0 0 0    Allergies  Allergen Reactions   Penicillins Hives   Social History   Social History Narrative   Marital status/children/pets: married, 2 children   Education/employment: B.S. Electrical eng.    Safety:       -Wears a bicycle helmet riding a bike: No     -smoke alarm in the home:Yes     - wears seatbelt: Yes     - Feels safe in their relationships: Yes   Past Medical History:  Diagnosis Date   Chicken pox    GERD (gastroesophageal reflux disease)    Gestational diabetes    Infertility, female    Thyroid disease    Vegetarian diet    Past Surgical History:  Procedure Laterality Date   NO PAST SURGERIES     WISDOM TOOTH EXTRACTION     Family History  Problem Relation Age of Onset   Hypertension Mother    Hypertension Father    Breast cancer Paternal Grandmother    Diabetes Paternal Grandmother    Hypertension Paternal Grandmother    Hypertension Paternal Grandfather    Allergies as of 11/01/2021       Reactions   Penicillins Hives        Medication List        Accurate as of November 01, 2021  5:28 PM. If you have any questions, ask your nurse or doctor.          STOP taking these medications    predniSONE 20 MG tablet Commonly known as: DELTASONE  TAKE these medications    Biotin 71696 MCG Tabs Take 1 tablet by mouth daily.   doxycycline 100 MG tablet Commonly known as: VIBRA-TABS Take 1 tablet (100 mg total) by mouth 2 (two) times daily.   fluticasone 50 MCG/ACT nasal spray Commonly known as: FLONASE SPRAY 2 SPRAYS INTO EACH NOSTRIL EVERY DAY   levocetirizine 5 MG tablet Commonly known as: XYZAL Take 1 tablet (5 mg total) by mouth every evening.   multivitamin capsule Take 1 capsule by mouth daily.   pantoprazole 40 MG tablet Commonly known as: PROTONIX TAKE 1 TABLET BY MOUTH EVERY DAY   Vitamin D3 50 MCG (2000 UT) Tabs Take 1 tablet by mouth daily.        All past medical history, surgical history, allergies, family history, immunizations andmedications were updated in the EMR today and reviewed under the history and medication portions of their EMR.     ROS Negative, with the exception of above mentioned in HPI   Objective:   Wt 145 lb (65.8 kg)   LMP 10/21/2021   BMI 26.02 kg/m  Body mass index is 26.02 kg/m. Physical Exam Gen: Afebrile. No acute distress.  HENT: AT. [place].  Chest: Mild cough during visit.  Normal work of breath. Neuro:  Alert. Oriented x3 Psych: Normal affect, dress and demeanor. Normal speech. Normal thought content and judgment.    No results found. No results found. No results found for this or any previous visit (from the past 24 hour(s)).  Assessment/Plan: Sonya Clayton is a 41 y.o. female present for OV for  Bacterial sinusitis Rest, hydrate.  +/- flonase, mucinex (DM if cough), nettie pot or nasal saline.  Doxy bid prescribed, take until completed.  If cough present it can last up to 6-8 weeks.  F/U 2 weeks if not improved.    Reviewed expectations re: course of current medical issues. Discussed self-management of symptoms. Outlined signs and symptoms indicating need for more acute intervention. Patient verbalized understanding and all questions were answered. Patient received an After-Visit Summary.    No orders of the defined types were placed in this encounter.  Meds ordered this encounter  Medications   doxycycline (VIBRA-TABS) 100 MG tablet    Sig: Take 1 tablet (100 mg total) by mouth 2 (two) times daily.    Dispense:  20 tablet    Refill:  0   Referral Orders  No referral(s) requested today     Note is dictated utilizing voice recognition software. Although note has been proof read prior to signing, occasional typographical errors still can be missed. If any questions arise, please do not hesitate to call for verification.   electronically signed by:  Felix Pacini, DO  [facility] [place]

## 2021-12-18 ENCOUNTER — Other Ambulatory Visit: Payer: Self-pay | Admitting: Family Medicine

## 2022-03-21 ENCOUNTER — Ambulatory Visit (INDEPENDENT_AMBULATORY_CARE_PROVIDER_SITE_OTHER): Payer: 59 | Admitting: Family Medicine

## 2022-03-21 ENCOUNTER — Encounter: Payer: Self-pay | Admitting: Family Medicine

## 2022-03-21 VITALS — BP 105/71 | HR 69 | Temp 97.5°F | Ht 62.5 in | Wt 142.0 lb

## 2022-03-21 DIAGNOSIS — B9689 Other specified bacterial agents as the cause of diseases classified elsewhere: Secondary | ICD-10-CM | POA: Diagnosis not present

## 2022-03-21 DIAGNOSIS — J329 Chronic sinusitis, unspecified: Secondary | ICD-10-CM

## 2022-03-21 MED ORDER — LEVOCETIRIZINE DIHYDROCHLORIDE 5 MG PO TABS: 5.0000 mg | ORAL_TABLET | Freq: Every evening | ORAL | 3 refills | Status: DC

## 2022-03-21 MED ORDER — FLUTICASONE PROPIONATE 50 MCG/ACT NA SUSP: NASAL | 2 refills | Status: AC

## 2022-03-21 MED ORDER — CEFDINIR 300 MG PO CAPS: 300.0000 mg | ORAL_CAPSULE | Freq: Two times a day (BID) | ORAL | 0 refills | Status: DC

## 2022-03-21 NOTE — Patient Instructions (Signed)
Return if symptoms worsen or fail to improve.        Great to see you today.  I have refilled the medication(s) we provide.   If labs were collected, we will inform you of lab results once received either by echart message or telephone call.   - echart message- for normal results that have been seen by the patient already.   - telephone call: abnormal results or if patient has not viewed results in their echart.  

## 2022-03-21 NOTE — Addendum Note (Signed)
Addended by: Felix Pacini A on: 03/21/2022 05:08 PM   Modules accepted: Orders

## 2022-03-21 NOTE — Progress Notes (Addendum)
Sonya Clayton , [birth year], 41 y.o., female MRN: [id number] Patient Care Team    Relationship Specialty Notifications Start End  Natalia Leatherwood, DO PCP - General Family Medicine  11/20/17   Romualdo Bolk, MD Consulting Physician Obstetrics and Gynecology  01/03/20     Chief Complaint  Patient presents with   Ear Pain    Pt c/o ear pain sinus pressure x 3 weeks     Subjective: Pt presents for an OV with complaints of bilateral ear pain, left greater than right.  She has had sinus pressure, headache and fatigue.     03/21/2022    1:47 PM 05/01/2021   10:35 AM 03/12/2021    1:33 PM 12/10/2020    1:38 PM 01/03/2020    8:35 AM  Depression screen PHQ 2/9  Decreased Interest 0 0 0 0 0  Down, Depressed, Hopeless 0 0 0 0 0  PHQ - 2 Score 0 0 0 0 0    Allergies  Allergen Reactions   Penicillins Hives   Social History   Social History Narrative   Marital status/children/pets: married, 2 children   Education/employment: B.S. Electrical eng.    Safety:      -Wears a bicycle helmet riding a bike: No     -smoke alarm in the home:Yes     - wears seatbelt: Yes     - Feels safe in their relationships: Yes   Past Medical History:  Diagnosis Date   Chicken pox    GERD (gastroesophageal reflux disease)    Gestational diabetes    Infertility, female    Thyroid disease    Vegetarian diet    Past Surgical History:  Procedure Laterality Date   NO PAST SURGERIES     WISDOM TOOTH EXTRACTION     Family History  Problem Relation Age of Onset   Hypertension Mother    Hypertension Father    Breast cancer Paternal Grandmother    Diabetes Paternal Grandmother    Hypertension Paternal Grandmother    Hypertension Paternal Grandfather    Allergies as of 03/21/2022       Reactions   Penicillins Hives        Medication List        Accurate as of March 21, 2022  5:08 PM. If you have any questions, ask your nurse or doctor.          STOP taking these  medications    doxycycline 100 MG tablet Commonly known as: VIBRA-TABS Stopped by: Felix Pacini, DO       TAKE these medications    Biotin 50932 MCG Tabs Take 1 tablet by mouth daily.   cefdinir 300 MG capsule Commonly known as: OMNICEF Take 1 capsule (300 mg total) by mouth 2 (two) times daily. Started by: Felix Pacini, DO   fluticasone 50 MCG/ACT nasal spray Commonly known as: FLONASE SPRAY 2 SPRAYS INTO EACH NOSTRIL EVERY DAY   levocetirizine 5 MG tablet Commonly known as: XYZAL Take 1 tablet (5 mg total) by mouth every evening.   multivitamin capsule Take 1 capsule by mouth daily.   pantoprazole 40 MG tablet Commonly known as: PROTONIX TAKE 1 TABLET BY MOUTH EVERY DAY   Vitamin D3 50 MCG (2000 UT) Tabs Take 1 tablet by mouth daily.        All past medical history, surgical history, allergies, family history, immunizations andmedications were updated in the EMR today and reviewed under the history and medication portions  of their EMR.     Review of Systems  HENT:  Positive for ear pain and sinus pain.    Negative, with the exception of above mentioned in HPI   Objective:  BP 105/71   Pulse 69   Temp (!) 97.5 F (36.4 C) (Oral)   Ht 5' 2.5" (1.588 m)   Wt 142 lb (64.4 kg)   LMP 03/02/2022   SpO2 98%   BMI 25.56 kg/m  Body mass index is 25.56 kg/m. Physical Exam Vitals and nursing note reviewed.  Constitutional:      General: She is not in acute distress.    Appearance: Normal appearance. She is normal weight. She is not ill-appearing or toxic-appearing.  HENT:     Head: Normocephalic and atraumatic.     Comments: TTP maxillary sinus    Right Ear: Tympanic membrane and ear canal normal.     Left Ear: Tympanic membrane and ear canal normal.     Nose: Congestion and rhinorrhea present.     Mouth/Throat:     Mouth: Mucous membranes are moist.     Pharynx: No oropharyngeal exudate or posterior oropharyngeal erythema.  Eyes:     General: No  scleral icterus.       Right eye: No discharge.        Left eye: No discharge.     Extraocular Movements: Extraocular movements intact.     Conjunctiva/sclera: Conjunctivae normal.     Pupils: Pupils are equal, round, and reactive to light.  Cardiovascular:     Rate and Rhythm: Normal rate and regular rhythm.     Heart sounds: No murmur heard. Pulmonary:     Effort: Pulmonary effort is normal. No respiratory distress.     Breath sounds: Normal breath sounds. No wheezing, rhonchi or rales.  Musculoskeletal:     Cervical back: Neck supple. Tenderness present.  Lymphadenopathy:     Cervical: Cervical adenopathy present.  Skin:    Findings: No rash.  Neurological:     Mental Status: She is alert and oriented to person, place, and time. Mental status is at baseline.  Psychiatric:        Mood and Affect: Mood normal.        Behavior: Behavior normal.        Thought Content: Thought content normal.        Judgment: Judgment normal.     No results found. No results found. No results found for this or any previous visit (from the past 24 hour(s)).  Assessment/Plan: Sonya Clayton is a 41 y.o. female present for OV for  Bacterial sinusitis Rest, hydrate.  +/- flonase, mucinex (DM if cough), nettie pot or nasal saline.  Omnicef prescribed, take until completed.  Continue Flonase, restart Xyzal nightly F/U 2 weeks if not improved.   Reviewed expectations re: course of current medical issues. Discussed self-management of symptoms. Outlined signs and symptoms indicating need for more acute intervention. Patient verbalized understanding and all questions were answered. Patient received an After-Visit Summary.    Orders Placed This Encounter  Procedures   Ambulatory referral to Allergy   Meds ordered this encounter  Medications   cefdinir (OMNICEF) 300 MG capsule    Sig: Take 1 capsule (300 mg total) by mouth 2 (two) times daily.    Dispense:  20 capsule    Refill:  0    fluticasone (FLONASE) 50 MCG/ACT nasal spray    Sig: SPRAY 2 SPRAYS INTO EACH NOSTRIL EVERY DAY  Dispense:  16 g    Refill:  2   levocetirizine (XYZAL) 5 MG tablet    Sig: Take 1 tablet (5 mg total) by mouth every evening.    Dispense:  30 tablet    Refill:  3   Referral Orders         Ambulatory referral to Allergy       Note is dictated utilizing voice recognition software. Although note has been proof read prior to signing, occasional typographical errors still can be missed. If any questions arise, please do not hesitate to call for verification.   electronically signed by:  Felix Pacini, DO  [facility] [place]

## 2022-03-24 ENCOUNTER — Other Ambulatory Visit: Payer: Self-pay | Admitting: Family Medicine

## 2022-03-24 DIAGNOSIS — Z1231 Encounter for screening mammogram for malignant neoplasm of breast: Secondary | ICD-10-CM

## 2022-03-31 NOTE — Progress Notes (Unsigned)
New Patient Note  RE: Sonya Clayton MRN: [id number] DOB: July 17, 1980 Date of Office Visit: 04/01/2022  Consult requested by: Ma Hillock, DO Primary care provider: Ma Hillock, DO  Chief Complaint: No chief complaint on file.  History of Present Illness: I had the pleasure of seeing Sonya [facility] for initial evaluation at the [facility] on 03/31/2022. She is a 41 y.o. female, who is referred here by Howard Pouch A, DO for the evaluation of allergic rhinitis.  She reports symptoms of ***. Symptoms have been going on for *** years. The symptoms are present *** all year around with worsening in ***. Other triggers include exposure to ***. Anosmia: ***. Headache: ***. She has used *** with ***fair improvement in symptoms. Sinus infections: ***. Previous work up includes: ***. Previous ENT evaluation: ***. Previous sinus imaging: ***. History of nasal polyps: ***. Last eye exam: ***. History of reflux: ***.  Assessment and Plan: Sonya Clayton is a 41 y.o. female with: No problem-specific Assessment & Plan notes found for this encounter.  No follow-ups on file.  No orders of the defined types were placed in this encounter.  Lab Orders  No laboratory test(s) ordered today    Other allergy screening: Asthma: {Blank single:19197::"yes","no"} Rhino conjunctivitis: {Blank single:19197::"yes","no"} Food allergy: {Blank single:19197::"yes","no"} Medication allergy: {Blank single:19197::"yes","no"} Hymenoptera allergy: {Blank single:19197::"yes","no"} Urticaria: {Blank single:19197::"yes","no"} Eczema:{Blank single:19197::"yes","no"} History of recurrent infections suggestive of immunodeficency: {Blank single:19197::"yes","no"}  Diagnostics: Spirometry:  Tracings reviewed. Her effort: {Blank single:19197::"Good reproducible efforts.","It was hard to get consistent efforts and there is a question as to whether this reflects a maximal maneuver.","Poor effort,  data can not be interpreted."} FVC: ***L FEV1: ***L, ***% predicted FEV1/FVC ratio: ***% Interpretation: {Blank single:19197::"Spirometry consistent with mild obstructive disease","Spirometry consistent with moderate obstructive disease","Spirometry consistent with severe obstructive disease","Spirometry consistent with possible restrictive disease","Spirometry consistent with mixed obstructive and restrictive disease","Spirometry uninterpretable due to technique","Spirometry consistent with normal pattern","No overt abnormalities noted given today's efforts"}.  Please see scanned spirometry results for details.  Skin Testing: {Blank single:19197::"Select foods","Environmental allergy panel","Environmental allergy panel and select foods","Food allergy panel","None","Deferred due to recent antihistamines use"}. *** Results discussed with patient/family.   Past Medical History: Patient Active Problem List   Diagnosis Date Noted  . Iron deficiency 05/01/2021  . Vitamin D deficiency 12/08/2018  . Abnormal TSH 11/20/2017  . Hair loss 11/20/2017  . Vegetarian diet 11/20/2017  . Use of proton pump inhibitor therapy 11/20/2017  . Gastroesophageal reflux disease with esophagitis 11/20/2017   Past Medical History:  Diagnosis Date  . Chicken pox   . GERD (gastroesophageal reflux disease)   . Gestational diabetes   . Infertility, female   . Thyroid disease   . Vegetarian diet    Past Surgical History: Past Surgical History:  Procedure Laterality Date  . NO PAST SURGERIES    . WISDOM TOOTH EXTRACTION     Medication List:  Current Outpatient Medications  Medication Sig Dispense Refill  . Biotin 10000 MCG TABS Take 1 tablet by mouth daily.    . cefdinir (OMNICEF) 300 MG capsule Take 1 capsule (300 mg total) by mouth 2 (two) times daily. 20 capsule 0  . Cholecalciferol (VITAMIN D3) 50 MCG (2000 UT) TABS Take 1 tablet by mouth daily.    . fluticasone (FLONASE) 50 MCG/ACT nasal spray SPRAY  2 SPRAYS INTO EACH NOSTRIL EVERY DAY 16 g 2  . levocetirizine (XYZAL) 5 MG tablet Take 1 tablet (5 mg total) by mouth every evening. 30 tablet  3  . Multiple Vitamin (MULTIVITAMIN) capsule Take 1 capsule by mouth daily.    . pantoprazole (PROTONIX) 40 MG tablet TAKE 1 TABLET BY MOUTH EVERY DAY 90 tablet 1   No current facility-administered medications for this visit.   Allergies: Allergies  Allergen Reactions  . Penicillins Hives   Social History: Social History   Socioeconomic History  . Marital status: Married    Spouse name: Not on file  . Number of children: Not on file  . Years of education: Not on file  . Highest education level: Not on file  Occupational History  . Not on file  Tobacco Use  . Smoking status: Never  . Smokeless tobacco: Never  Vaping Use  . Vaping Use: Never used  Substance and Sexual Activity  . Alcohol use: Never  . Drug use: Never  . Sexual activity: Yes    Partners: Male  Other Topics Concern  . Not on file  Social History Narrative   Marital status/children/pets: married, 2 children   Education/employment: B.S. Electrical eng.    Safety:      -Wears a bicycle helmet riding a bike: No     -smoke alarm in the home:Yes     - wears seatbelt: Yes     - Feels safe in their relationships: Yes   Social Determinants of Health   Financial Resource Strain: Not on file  Food Insecurity: Not on file  Transportation Needs: Not on file  Physical Activity: Not on file  Stress: Not on file  Social Connections: Not on file   Lives in a ***. Smoking: *** Occupation: ***  Environmental HistoryFreight forwarder in the house: Estate agent in the family room: {Blank single:19197::"yes","no"} Carpet in the bedroom: {Blank single:19197::"yes","no"} Heating: {Blank single:19197::"electric","gas","heat pump"} Cooling: {Blank single:19197::"central","window","heat pump"} Pet: {Blank single:19197::"yes ***","no"}  Family  History: Family History  Problem Relation Age of Onset  . Hypertension Mother   . Hypertension Father   . Breast cancer Paternal Grandmother   . Diabetes Paternal Grandmother   . Hypertension Paternal Grandmother   . Hypertension Paternal Grandfather    Problem                               Relation Asthma                                   *** Eczema                                *** Food allergy                          *** Allergic rhino conjunctivitis     ***  Review of Systems  Constitutional:  Negative for appetite change, chills, fever and unexpected weight change.  HENT:  Negative for congestion and rhinorrhea.   Eyes:  Negative for itching.  Respiratory:  Negative for cough, chest tightness, shortness of breath and wheezing.   Cardiovascular:  Negative for chest pain.  Gastrointestinal:  Negative for abdominal pain.  Genitourinary:  Negative for difficulty urinating.  Skin:  Negative for rash.  Neurological:  Negative for headaches.   Objective: LMP 03/02/2022  There is no height or weight on file to calculate BMI. Physical Exam Vitals and nursing  note reviewed.  Constitutional:      Appearance: Normal appearance. She is well-developed.  HENT:     Head: Normocephalic and atraumatic.     Right Ear: Tympanic membrane and external ear normal.     Left Ear: Tympanic membrane and external ear normal.     Nose: Nose normal.     Mouth/Throat:     Mouth: Mucous membranes are moist.     Pharynx: Oropharynx is clear.  Eyes:     Conjunctiva/sclera: Conjunctivae normal.  Cardiovascular:     Rate and Rhythm: Normal rate and regular rhythm.     Heart sounds: Normal heart sounds. No murmur heard.    No friction rub. No gallop.  Pulmonary:     Effort: Pulmonary effort is normal.     Breath sounds: Normal breath sounds. No wheezing, rhonchi or rales.  Musculoskeletal:     Cervical back: Neck supple.  Skin:    General: Skin is warm.     Findings: No rash.  Neurological:      Mental Status: She is alert and oriented to person, place, and time.  Psychiatric:        Behavior: Behavior normal.  The plan was reviewed with the patient/family, and all questions/concerned were addressed.  It was my pleasure to see Sonya Clayton today and participate in her care. Please feel free to contact me with any questions or concerns.  Sincerely,  Wyline Mood, DO Allergy & Immunology  [facility] office: [phone] [facility] office: [phone]

## 2022-04-01 ENCOUNTER — Ambulatory Visit (INDEPENDENT_AMBULATORY_CARE_PROVIDER_SITE_OTHER): Payer: 59 | Admitting: Allergy

## 2022-04-01 ENCOUNTER — Encounter: Payer: Self-pay | Admitting: Allergy

## 2022-04-01 VITALS — BP 108/72 | HR 84 | Temp 97.0°F | Resp 16 | Ht 62.75 in | Wt 142.5 lb

## 2022-04-01 DIAGNOSIS — Z880 Allergy status to penicillin: Secondary | ICD-10-CM

## 2022-04-01 DIAGNOSIS — J310 Chronic rhinitis: Secondary | ICD-10-CM | POA: Diagnosis not present

## 2022-04-01 DIAGNOSIS — K219 Gastro-esophageal reflux disease without esophagitis: Secondary | ICD-10-CM

## 2022-04-01 DIAGNOSIS — B999 Unspecified infectious disease: Secondary | ICD-10-CM | POA: Diagnosis not present

## 2022-04-01 NOTE — Patient Instructions (Signed)
Rhinitis  Continue Xyzal (levocetirizine) 5mg  daily.  Use Flonase (fluticasone) nasal spray 1 spray per nostril twice a day as needed for nasal congestion.  Nasal saline spray (i.e., Simply Saline) or nasal saline lavage (i.e., NeilMed) is recommended as needed and prior to medicated nasal sprays.  Infections Keep track of infections and antibiotics use.  May need to get bloodwork to look at immune system.  Heartburn: See handout for lifestyle and dietary modifications. Continue pantoprazole 40mg  daily. Nothing to eat or drink for 30 minutes afterwards.   Penicillin allergy: Consider penicillin allergy skin testing and in office drug challenge in the future.  Over 90% of people with history of penicillin allergy which occurred over 10 years ago are found to be non-allergic.  You must be off antihistamines for 3-5 days before. Plan on being in the office for 2-3 hours. You must call to schedule an appointment and specify it's for a drug challenge.  A few days prior to the appointment, I will send in a prescription for amoxicillin liquid which you must bring to the appointment as well.    Follow up for allergy testing - no Xyzal (levocetirizine) for 3 days.

## 2022-04-01 NOTE — Assessment & Plan Note (Signed)
3-4 infections per year. Keep track of infections and antibiotics use.  May need to get bloodwork to look at immune system.

## 2022-04-01 NOTE — Assessment & Plan Note (Deleted)
Taking PPI daily. Still likes to drink coffee and eat spicy foods.  See handout for lifestyle and dietary modifications. Continue pantoprazole 40mg  daily. Nothing to eat or drink for 30 minutes afterwards.

## 2022-04-01 NOTE — Assessment & Plan Note (Signed)
Rhinitis symptoms mainly in the fall/winter. Recently started on Xyzal and Flonase. Gets sinus infections. Takes PPI for GERD. No recent ENT evaluation.  Unable to skin test today as [facility] won't allow new patient visits and procedures on the same day.  Return for skin testing.  Continue Xyzal (levocetirizine) 5mg  daily.  Use Flonase (fluticasone) nasal spray 1 spray per nostril twice a day as needed for nasal congestion.  Nasal saline spray (i.e., Simply Saline) or nasal saline lavage (i.e., NeilMed) is recommended as needed and prior to medicated nasal sprays.

## 2022-04-01 NOTE — Assessment & Plan Note (Signed)
Taking PPI daily. Still likes to drink coffee and eat spicy foods.  See handout for lifestyle and dietary modifications. Continue pantoprazole 40mg daily. Nothing to eat or drink for 30 minutes afterwards.  

## 2022-04-01 NOTE — Assessment & Plan Note (Signed)
Itchy/rash in 2011. Consider penicillin skin testing/drug challenge in the future. More than 90% of patients outgrow their penicillin allergy.

## 2022-04-21 NOTE — Progress Notes (Unsigned)
Follow Up Note  RE: Sonya Clayton MRN: [id number] DOB: [birth year] Date of Office Visit: 04/22/2022  Referring provider: Ma Hillock, DO Primary care provider: Ma Hillock, DO  Chief Complaint: No chief complaint on file.  History of Present Illness: I had the pleasure of seeing Sonya Clayton for a follow up visit at the [facility] on 04/21/2022. She is a 42 y.o. female, who is being followed for rhinitis, recurrent infections, GERD and penicillin allergy. Her previous allergy office visit was on 04/01/2022 with Dr. Maudie Clayton. Today is a skin testing and follow up visit.  Chronic rhinitis Rhinitis symptoms mainly in the fall/winter. Recently started on Xyzal and Flonase. Gets sinus infections. Takes PPI for GERD. No recent ENT evaluation.  Unable to skin test today as [facility] won't allow new patient visits and procedures on the same day.  Return for skin testing.  Continue Xyzal (levocetirizine) 5mg  daily.  Use Flonase (fluticasone) nasal spray 1 spray per nostril twice a day as needed for nasal congestion.  Nasal saline spray (i.e., Simply Saline) or nasal saline lavage (i.e., NeilMed) is recommended as needed and prior to medicated nasal sprays.   Recurrent infections 3-4 infections per year. Keep track of infections and antibiotics use.  May need to get bloodwork to look at immune system.   GERD (gastroesophageal reflux disease) Taking PPI daily. Still likes to drink coffee and eat spicy foods. See handout for lifestyle and dietary modifications. Continue pantoprazole 40mg  daily. Nothing to eat or drink for 30 minutes afterwards.    Penicillin allergy Itchy/rash in 2011. Consider penicillin skin testing/drug challenge in the future. More than 90% of patients outgrow their penicillin allergy.    Return for Skin testing.  Assessment and Plan: Sonya Clayton is a 42 y.o. female with: No problem-specific Assessment & Plan notes found for this encounter.  No  follow-ups on file.  No orders of the defined types were placed in this encounter.  Lab Orders  No laboratory test(s) ordered today    Diagnostics: Spirometry:  Tracings reviewed. Her effort: {Blank single:19197::"Good reproducible efforts.","It was hard to get consistent efforts and there is a question as to whether this reflects a maximal maneuver.","Poor effort, data can not be interpreted."} FVC: ***L FEV1: ***L, ***% predicted FEV1/FVC ratio: ***% Interpretation: {Blank single:19197::"Spirometry consistent with mild obstructive disease","Spirometry consistent with moderate obstructive disease","Spirometry consistent with severe obstructive disease","Spirometry consistent with possible restrictive disease","Spirometry consistent with mixed obstructive and restrictive disease","Spirometry uninterpretable due to technique","Spirometry consistent with normal pattern","No overt abnormalities noted given today's efforts"}.  Please see scanned spirometry results for details.  Skin Testing: {Blank single:19197::"Select foods","Environmental allergy panel","Environmental allergy panel and select foods","Food allergy panel","None","Deferred due to recent antihistamines use"}. *** Results discussed with patient/family.   Medication List:  Current Outpatient Medications  Medication Sig Dispense Refill   Biotin 10000 MCG TABS Take 1 tablet by mouth daily.     Cholecalciferol (VITAMIN D3) 50 MCG (2000 UT) TABS Take 1 tablet by mouth daily.     fluticasone (FLONASE) 50 MCG/ACT nasal spray SPRAY 2 SPRAYS INTO EACH NOSTRIL EVERY DAY 16 g 2   levocetirizine (XYZAL) 5 MG tablet Take 1 tablet (5 mg total) by mouth every evening. 30 tablet 3   Multiple Vitamin (MULTIVITAMIN) capsule Take 1 capsule by mouth daily.     pantoprazole (PROTONIX) 40 MG tablet TAKE 1 TABLET BY MOUTH EVERY DAY 90 tablet 1   No current facility-administered medications for this visit.   Allergies: Allergies  Allergen  Reactions   Penicillins Hives   I reviewed her past medical history, social history, family history, and environmental history and no significant changes have been reported from her previous visit.  Review of Systems  Constitutional:  Negative for appetite change, chills, fever and unexpected weight change.  HENT:  Positive for congestion, postnasal drip and sinus pressure. Negative for rhinorrhea.   Eyes:  Negative for itching.  Respiratory:  Negative for cough, chest tightness, shortness of breath and wheezing.   Cardiovascular:  Negative for chest pain.  Gastrointestinal:  Negative for abdominal pain.  Genitourinary:  Negative for difficulty urinating.  Skin:  Negative for rash.  Neurological:  Positive for headaches.    Objective: There were no vitals taken for this visit. There is no height or weight on file to calculate BMI. Physical Exam Vitals and nursing note reviewed.  Constitutional:      Appearance: Normal appearance. She is well-developed.  HENT:     Head: Normocephalic and atraumatic.     Right Ear: Tympanic membrane and external ear normal.     Left Ear: Tympanic membrane and external ear normal.     Nose: Nose normal.     Mouth/Throat:     Mouth: Mucous membranes are moist.     Pharynx: Oropharynx is clear.  Eyes:     Conjunctiva/sclera: Conjunctivae normal.  Cardiovascular:     Rate and Rhythm: Normal rate and regular rhythm.     Heart sounds: Normal heart sounds. No murmur heard.    No friction rub. No gallop.  Pulmonary:     Effort: Pulmonary effort is normal.     Breath sounds: Normal breath sounds. No wheezing, rhonchi or rales.  Musculoskeletal:     Cervical back: Neck supple.  Skin:    General: Skin is warm.     Findings: No rash.  Neurological:     Mental Status: She is alert and oriented to person, place, and time.  Psychiatric:        Behavior: Behavior normal.    Previous notes and tests were reviewed. The plan was reviewed with the  patient/family, and all questions/concerned were addressed.  It was my pleasure to see Sonya today and participate in her care. Please feel free to contact me with any questions or concerns.  Sincerely,  Wyline Mood, DO Allergy & Immunology  [facility] office: [phone] [facility] office: [phone]

## 2022-04-22 ENCOUNTER — Encounter: Payer: Self-pay | Admitting: Allergy

## 2022-04-22 ENCOUNTER — Telehealth: Payer: Self-pay

## 2022-04-22 ENCOUNTER — Ambulatory Visit (INDEPENDENT_AMBULATORY_CARE_PROVIDER_SITE_OTHER): Payer: 59 | Admitting: Allergy

## 2022-04-22 VITALS — BP 100/60 | HR 81 | Temp 98.1°F | Resp 16

## 2022-04-22 DIAGNOSIS — J310 Chronic rhinitis: Secondary | ICD-10-CM | POA: Diagnosis not present

## 2022-04-22 DIAGNOSIS — B999 Unspecified infectious disease: Secondary | ICD-10-CM

## 2022-04-22 DIAGNOSIS — K219 Gastro-esophageal reflux disease without esophagitis: Secondary | ICD-10-CM

## 2022-04-22 DIAGNOSIS — Z880 Allergy status to penicillin: Secondary | ICD-10-CM

## 2022-04-22 MED ORDER — PREDNISONE 10 MG PO TABS: ORAL_TABLET | ORAL | 0 refills | Status: DC

## 2022-04-22 MED ORDER — AZITHROMYCIN 250 MG PO TABS: ORAL_TABLET | ORAL | 0 refills | Status: DC

## 2022-04-22 NOTE — Assessment & Plan Note (Signed)
Past history - 3-4 infections per year. Interim history - took cefdinir in December with no benefit.  Keep track of infections and antibiotics use. Get bloodwork to look at immune system. Get 2 weeks after finished with antibiotics.  Possible current sinus infection Start prednisone taper. Prednisone 10mg  tablets - take 2 tablets for 4 days then 1 tablet on day 5.  Start zpak.

## 2022-04-22 NOTE — Patient Instructions (Addendum)
Today's skin testing showed: Negative to indoor/outdoor allergens.  Results given.  Non-allergic chronic rhinitis Recommend ENT evaluation.  Refer to ENT - chronic sinusitis.  Use Flonase (fluticasone) nasal spray 1 spray per nostril twice a day as needed for nasal congestion.  Nasal saline spray (i.e., Simply Saline) or nasal saline lavage (i.e., NeilMed) is recommended as needed and prior to medicated nasal sprays.  Recurrent infections Keep track of infections and antibiotics use. Get bloodwork to look at immune system. Get 2 weeks after done with antibiotics.  Possible sinus infection Start prednisone taper. Prednisone 10mg  tablets - take 2 tablets for 4 days then 1 tablet on day 5.  Start zpak.  GERD (gastroesophageal reflux disease) Continue lifestyle and dietary modifications. Continue pantoprazole 40mg  daily. Nothing to eat or drink for 30 minutes afterwards.    Penicillin allergy Itchy/rash in 2011. Consider penicillin skin testing/drug challenge in the future. More than 90% of patients outgrow their penicillin allergy.   Follow up in 3 months or sooner if needed.

## 2022-04-22 NOTE — Assessment & Plan Note (Signed)
Past history - itchy/rash in 2011. Consider penicillin skin testing/drug challenge in the future. More than 90% of patients outgrow their penicillin allergy.

## 2022-04-22 NOTE — Assessment & Plan Note (Signed)
Past history - Rhinitis symptoms mainly in the fall/winter. Recently started on Xyzal and Flonase. Gets sinus infections. Takes PPI for GERD. No recent ENT evaluation.  Interim history - no worsening symptoms since off antihistamines.  Today's skin testing showed: Negative to indoor/outdoor allergens. Most likely has non-allergic rhinitis.  Refer to ENT - chronic sinusitis.  Use Flonase (fluticasone) nasal spray 1 spray per nostril twice a day as needed for nasal congestion.  Nasal saline spray (i.e., Simply Saline) or nasal saline lavage (i.e., NeilMed) is recommended as needed and prior to medicated nasal sprays. No need to take daily antihistamines as ineffective.

## 2022-04-22 NOTE — Telephone Encounter (Signed)
Please refer per Dr Maudie Mercury to ENT for chronic sinusitis thank you

## 2022-04-22 NOTE — Assessment & Plan Note (Signed)
Continue lifestyle and dietary modifications. Continue pantoprazole 40mg  daily. Nothing to eat or drink for 30 minutes afterwards.

## 2022-05-02 ENCOUNTER — Ambulatory Visit (INDEPENDENT_AMBULATORY_CARE_PROVIDER_SITE_OTHER): Payer: 59 | Admitting: Family Medicine

## 2022-05-02 ENCOUNTER — Encounter: Payer: Self-pay | Admitting: Family Medicine

## 2022-05-02 VITALS — BP 130/87 | HR 71 | Temp 98.0°F | Ht 62.01 in | Wt 140.2 lb

## 2022-05-02 DIAGNOSIS — Z0000 Encounter for general adult medical examination without abnormal findings: Secondary | ICD-10-CM | POA: Diagnosis not present

## 2022-05-02 DIAGNOSIS — Z23 Encounter for immunization: Secondary | ICD-10-CM

## 2022-05-02 DIAGNOSIS — E559 Vitamin D deficiency, unspecified: Secondary | ICD-10-CM

## 2022-05-02 DIAGNOSIS — E611 Iron deficiency: Secondary | ICD-10-CM | POA: Diagnosis not present

## 2022-05-02 DIAGNOSIS — Z79899 Other long term (current) drug therapy: Secondary | ICD-10-CM

## 2022-05-02 DIAGNOSIS — R7989 Other specified abnormal findings of blood chemistry: Secondary | ICD-10-CM | POA: Diagnosis not present

## 2022-05-02 DIAGNOSIS — K219 Gastro-esophageal reflux disease without esophagitis: Secondary | ICD-10-CM

## 2022-05-02 DIAGNOSIS — Z13220 Encounter for screening for lipoid disorders: Secondary | ICD-10-CM

## 2022-05-02 DIAGNOSIS — J310 Chronic rhinitis: Secondary | ICD-10-CM

## 2022-05-02 DIAGNOSIS — Z789 Other specified health status: Secondary | ICD-10-CM

## 2022-05-02 LAB — IBC + FERRITIN
Ferritin: 35.4 ng/mL (ref 10.0–291.0)
Iron: 106 ug/dL (ref 42–145)
Saturation Ratios: 27.8 % (ref 20.0–50.0)
TIBC: 380.8 ug/dL (ref 250.0–450.0)
Transferrin: 272 mg/dL (ref 212.0–360.0)

## 2022-05-02 LAB — COMPREHENSIVE METABOLIC PANEL
ALT: 19 U/L (ref 0–35)
AST: 18 U/L (ref 0–37)
Albumin: 4.8 g/dL (ref 3.5–5.2)
Alkaline Phosphatase: 52 U/L (ref 39–117)
BUN: 9 mg/dL (ref 6–23)
CO2: 24 mEq/L (ref 19–32)
Calcium: 10 mg/dL (ref 8.4–10.5)
Chloride: 103 mEq/L (ref 96–112)
Creatinine, Ser: 0.67 mg/dL (ref 0.40–1.20)
GFR: 107.87 mL/min (ref >60.00)
Glucose, Bld: 87 mg/dL (ref 70–99)
Potassium: 4.2 mEq/L (ref 3.5–5.1)
Sodium: 137 mEq/L (ref 135–145)
Total Bilirubin: 0.5 mg/dL (ref 0.2–1.2)
Total Protein: 7.5 g/dL (ref 6.0–8.3)

## 2022-05-02 LAB — CBC
HCT: 41 % (ref 36.0–46.0)
Hemoglobin: 13.9 g/dL (ref 12.0–15.0)
MCHC: 33.9 g/dL (ref 30.0–36.0)
MCV: 86.3 fl (ref 78.0–100.0)
Platelets: 270 10*3/uL (ref 150.0–400.0)
RBC: 4.75 Mil/uL (ref 3.87–5.11)
RDW: 13.4 % (ref 11.5–15.5)
WBC: 7.8 10*3/uL (ref 4.0–10.5)

## 2022-05-02 LAB — LIPID PANEL
Cholesterol: 232 mg/dL — ABNORMAL HIGH (ref 0–200)
HDL: 52.9 mg/dL (ref >39.00)
NonHDL: 179.01
Total CHOL/HDL Ratio: 4
Triglycerides: 278 mg/dL — ABNORMAL HIGH (ref 0.0–149.0)
VLDL: 55.6 mg/dL — ABNORMAL HIGH (ref 0.0–40.0)

## 2022-05-02 LAB — LDL CHOLESTEROL, DIRECT: Direct LDL: 141 mg/dL

## 2022-05-02 LAB — MAGNESIUM: Magnesium: 2 mg/dL (ref 1.5–2.5)

## 2022-05-02 LAB — HEMOGLOBIN A1C: Hgb A1c MFr Bld: 5.4 % (ref 4.6–6.5)

## 2022-05-02 LAB — VITAMIN B12: Vitamin B-12: 273 pg/mL (ref 211–911)

## 2022-05-02 LAB — VITAMIN D 25 HYDROXY (VIT D DEFICIENCY, FRACTURES): VITD: 46.99 ng/mL (ref 30.00–100.00)

## 2022-05-02 LAB — TSH: TSH: 2.47 u[IU]/mL (ref 0.35–5.50)

## 2022-05-02 MED ORDER — PANTOPRAZOLE SODIUM 40 MG PO TBEC: 40.0000 mg | DELAYED_RELEASE_TABLET | Freq: Every day | ORAL | 3 refills | Status: DC

## 2022-05-02 NOTE — Progress Notes (Signed)
Patient ID: Sonya Clayton, female  DOB: 1980/10/04, 42 y.o.   MRN: [id number] Patient Care Team    Relationship Specialty Notifications Start End  Ma Hillock, DO PCP - General Family Medicine  11/20/17   Salvadore Dom, MD Consulting Physician Obstetrics and Gynecology  01/03/20     Chief Complaint  Patient presents with   Annual Exam    Pt is fasting    Subjective: Sonya Clayton is a 42 y.o.  Female  present for CPE All past medical history, surgical history, allergies, family history, immunizations, medications and social history were updated in the electronic medical record today. All recent labs, ED visits and hospitalizations within the last year were reviewed.  Health maintenance:  Colonoscopy: no fhx. Routine screen at 44 Mammogram: 04/19/2021- bcgso. PGM with breast cancer.  Cervical cancer screening: last pap: 2020 per pt. No prior abnormal. Uncertain if co-test preformed. She has a IUD that will need removed next year. No LMP recorded. (Menstrual status: IUD). Immunizations: tdap 2016 UTD, Influenza provided today,  COVID series completed. Infectious disease screening: HIV completed 2016, HEP c completed DEXA:routine screen. Assistive device: none Oxygen XTA:VWPV Patient has a Dental home. Hospitalizations/ED visits: reviewed     03/21/2022    1:47 PM 05/01/2021   10:35 AM 03/12/2021    1:33 PM 12/10/2020    1:38 PM 01/03/2020    8:35 AM  Depression screen PHQ 2/9  Decreased Interest 0 0 0 0 0  Down, Depressed, Hopeless 0 0 0 0 0  PHQ - 2 Score 0 0 0 0 0      05/01/2021   10:35 AM  GAD 7 : Generalized Anxiety Score  Nervous, Anxious, on Edge 0  Control/stop worrying 0  Worry too much - different things 0  Trouble relaxing 0  Restless 0  Easily annoyed or irritable 0  Afraid - awful might happen 0  Total GAD 7 Score 0  Anxiety Difficulty Not difficult at all   Immunization History  Administered Date(s) Administered   Influenza,inj,Quad  PF,6+ Mos 03/05/2018, 12/15/2018, 01/03/2020, 05/02/2022   PFIZER(Purple Top)SARS-COV-2 Vaccination 07/26/2019, 08/16/2019, 03/04/2020   Tdap 04/14/2014   Past Medical History:  Diagnosis Date   Chicken pox    GERD (gastroesophageal reflux disease)    Gestational diabetes    Infertility, female    Thyroid disease    Vegetarian diet    Allergies  Allergen Reactions   Penicillins Hives   Past Surgical History:  Procedure Laterality Date   NO PAST SURGERIES     WISDOM TOOTH EXTRACTION     Family History  Problem Relation Age of Onset   Hypertension Mother    Hypertension Father    Breast cancer Paternal Grandmother    Diabetes Paternal Grandmother    Hypertension Paternal Grandmother    Hypertension Paternal Grandfather    Social History   Social History Narrative   Marital status/children/pets: married, 2 children   Education/employment: B.S. Electrical eng.    Safety:      -Wears a bicycle helmet riding a bike: No     -smoke alarm in the home:Yes     - wears seatbelt: Yes     - Feels safe in their relationships: Yes    Allergies as of 05/02/2022       Reactions   Penicillins Hives        Medication List        Accurate as of May 02, 2022 10:02 AM. If  you have any questions, ask your nurse or doctor.          STOP taking these medications    azithromycin 250 MG tablet Commonly known as: Zithromax Z-Pak Stopped by: Howard Pouch, DO   predniSONE 10 MG tablet Commonly known as: DELTASONE Stopped by: Howard Pouch, DO       TAKE these medications    Biotin 10000 MCG Tabs Take 1 tablet by mouth daily.   fluticasone 50 MCG/ACT nasal spray Commonly known as: FLONASE SPRAY 2 SPRAYS INTO EACH NOSTRIL EVERY DAY   multivitamin capsule Take 1 capsule by mouth daily.   pantoprazole 40 MG tablet Commonly known as: PROTONIX Take 1 tablet (40 mg total) by mouth daily.   Vitamin D3 50 MCG (2000 UT) Tabs Take 1 tablet by mouth daily.         All past medical history, surgical history, allergies, family history, immunizations andmedications were updated in the EMR today and reviewed under the history and medication portions of their EMR.     No results found for this or any previous visit (from the past 2160 hour(s)).   ROS 14 pt review of systems performed and negative (unless mentioned in an HPI)  Objective: BP 130/87   Pulse 71   Temp 98 F (36.7 C)   Ht 5' 2.01" (1.575 m)   Wt 140 lb 3.2 oz (63.6 kg)   SpO2 98%   BMI 25.64 kg/m  Physical Exam Vitals and nursing note reviewed.  Constitutional:      General: She is not in acute distress.    Appearance: Normal appearance. She is not ill-appearing or toxic-appearing.  HENT:     Head: Normocephalic and atraumatic.     Right Ear: Tympanic membrane, ear canal and external ear normal. There is no impacted cerumen.     Left Ear: Tympanic membrane, ear canal and external ear normal. There is no impacted cerumen.     Nose: No congestion or rhinorrhea.     Mouth/Throat:     Mouth: Mucous membranes are moist.     Pharynx: Oropharynx is clear. No oropharyngeal exudate or posterior oropharyngeal erythema.  Eyes:     General: No scleral icterus.       Right eye: No discharge.        Left eye: No discharge.     Extraocular Movements: Extraocular movements intact.     Conjunctiva/sclera: Conjunctivae normal.     Pupils: Pupils are equal, round, and reactive to light.  Cardiovascular:     Rate and Rhythm: Normal rate and regular rhythm.     Pulses: Normal pulses.     Heart sounds: Normal heart sounds. No murmur heard.    No friction rub. No gallop.  Pulmonary:     Effort: Pulmonary effort is normal. No respiratory distress.     Breath sounds: Normal breath sounds. No stridor. No wheezing, rhonchi or rales.  Chest:     Chest wall: No tenderness.  Abdominal:     General: Abdomen is flat. Bowel sounds are normal. There is no distension.     Palpations: Abdomen is  soft. There is no mass.     Tenderness: There is no abdominal tenderness. There is no right CVA tenderness, left CVA tenderness, guarding or rebound.     Hernia: No hernia is present.  Musculoskeletal:        General: No swelling, tenderness or deformity. Normal range of motion.     Cervical back: Normal range of  motion and neck supple. No rigidity or tenderness.     Right lower leg: No edema.     Left lower leg: No edema.  Lymphadenopathy:     Cervical: No cervical adenopathy.  Skin:    General: Skin is warm and dry.     Coloration: Skin is not jaundiced or pale.     Findings: No bruising, erythema, lesion or rash.  Neurological:     General: No focal deficit present.     Mental Status: She is alert and oriented to person, place, and time. Mental status is at baseline.     Cranial Nerves: No cranial nerve deficit.     Sensory: No sensory deficit.     Motor: No weakness.     Coordination: Coordination normal.     Gait: Gait normal.     Deep Tendon Reflexes: Reflexes normal.  Psychiatric:        Mood and Affect: Mood normal.        Behavior: Behavior normal.        Thought Content: Thought content normal.        Judgment: Judgment normal.      No results found.  Assessment/plan: Jahdai Padovano is a 42 y.o. female present for CPE  Abnormal TSH - TSH Vegetarian diet - Vitamin D (25 hydroxy) - B12 Gastroesophageal reflux disease without esophagitis/Use of proton pump inhibitor therapy Stable Continue protonix QD - Vitamin D (25 hydroxy) - B12 - Magnesium Vitamin D deficiency Vit d collected today  Iron deficiency - CBC - IBC + Ferritin Lipid screening/Encounter for long-term current use of medication - Comprehensive metabolic panel - Hemoglobin A1c - Lipid panel Chronic sinusitis: Referred to ent today per pt request Continue Flonase Continue antihistamine . Influenza vaccine needed Provided today Routine general medical examination at a health care  facility Colonoscopy: no fhx. Routine screen at 44 Mammogram: 04/19/2021- bcgso. PGM with breast cancer.  Cervical cancer screening: last pap: 2020 per pt. No prior abnormal. Uncertain if co-test preformed. She has a IUD that will need removed next year. No LMP recorded. (Menstrual status: IUD). Immunizations: tdap 2016 UTD, Influenza provided today,  COVID series completed. Infectious disease screening: HIV completed 2016, HEP c completed DEXA:routine screen. Patient was encouraged to exercise greater than 150 minutes a week. Patient was encouraged to choose a diet filled with fresh fruits and vegetables, and lean meats. AVS provided to patient today for education/recommendation on gender specific.  - Comprehensive metabolic panel - Hemoglobin A1c - Lipid panelalth and safety maintenance.   Return in about 1 year (around 05/04/2023) for cpe (20 min).  Orders Placed This Encounter  Procedures   Flu Vaccine QUAD 65mo+IM (Fluarix, Fluzone & Alfiuria Quad PF)   CBC   Comprehensive metabolic panel   Hemoglobin A1c   Lipid panel   TSH   Vitamin D (25 hydroxy)   IBC + Ferritin   B12   Magnesium   Ambulatory referral to ENT   Meds ordered this encounter  Medications   pantoprazole (PROTONIX) 40 MG tablet    Sig: Take 1 tablet (40 mg total) by mouth daily.    Dispense:  90 tablet    Refill:  3   Referral Orders         Ambulatory referral to ENT       Electronically signed by: Felix Pacini, DO [facility] [place]

## 2022-05-02 NOTE — Patient Instructions (Signed)
Return in about 1 year (around 05/04/2023) for cpe (20 min).        Great to see you today.  I have refilled the medication(s) we provide.   If labs were collected, we will inform you of lab results once received either by echart message or telephone call.   - echart message- for normal results that have been seen by the patient already.   - telephone call: abnormal results or if patient has not viewed results in their echart.

## 2022-05-05 ENCOUNTER — Telehealth: Payer: Self-pay | Admitting: Family Medicine

## 2022-05-05 NOTE — Telephone Encounter (Signed)
Please call patient Liver, kidney and thyroid function are normal Blood cell counts and electrolytes are normal Iron panel looks excellent. Vitamin D is great at 35 Diabetes screening/A1c is normal  Cholesterol panel is at goal for her.  B12 is significantly low at 273.  I recommend she start over-the-counter B12 supplement 1000 mcg sublingual daily.  This is placed under the tongue and comes in a solution or tablet form.

## 2022-05-05 NOTE — Telephone Encounter (Signed)
Spoke with patient regarding results/recommendations.  

## 2022-05-12 ENCOUNTER — Telehealth: Payer: Self-pay | Admitting: Allergy

## 2022-05-12 NOTE — Telephone Encounter (Signed)
REFERRED PATIENT TO [facility] [address] [place] [address] (P) [phone]  FAXED REFERRAL AND ALL CORRESPONDING NOTES TO THEIR OFFICE.  THEY WILL REACH OUT TO PATIENT TO SCHEDULE.  PCP HAS ALSO PLACE IDENTICAL REFERRAL.

## 2022-05-16 ENCOUNTER — Ambulatory Visit: Payer: 59

## 2022-05-20 LAB — CBC WITH DIFFERENTIAL/PLATELET
Basophils Absolute: 0.1 10*3/uL (ref 0.0–0.2)
Basos: 1 %
EOS (ABSOLUTE): 0.2 10*3/uL (ref 0.0–0.4)
Eos: 3 %
Hematocrit: 40.6 % (ref 34.0–46.6)
Hemoglobin: 13.8 g/dL (ref 11.1–15.9)
Immature Grans (Abs): 0 10*3/uL (ref 0.0–0.1)
Immature Granulocytes: 0 %
Lymphocytes Absolute: 2 10*3/uL (ref 0.7–3.1)
Lymphs: 28 %
MCH: 29.3 pg (ref 26.6–33.0)
MCHC: 34 g/dL (ref 31.5–35.7)
MCV: 86 fL (ref 79–97)
Monocytes Absolute: 0.5 10*3/uL (ref 0.1–0.9)
Monocytes: 7 %
Neutrophils Absolute: 4.4 10*3/uL (ref 1.4–7.0)
Neutrophils: 61 %
Platelets: 234 10*3/uL (ref 150–450)
RBC: 4.71 x10E6/uL (ref 3.77–5.28)
RDW: 12.2 % (ref 11.7–15.4)
WBC: 7.1 10*3/uL (ref 3.4–10.8)

## 2022-05-20 LAB — STREP PNEUMONIAE 23 SEROTYPES IGG
Pneumo Ab Type 1*: 0.4 ug/mL — ABNORMAL LOW (ref >1.3)
Pneumo Ab Type 12 (12F)*: 0.3 ug/mL — ABNORMAL LOW (ref >1.3)
Pneumo Ab Type 14*: 0.7 ug/mL — ABNORMAL LOW (ref >1.3)
Pneumo Ab Type 17 (17F)*: 1.1 ug/mL — ABNORMAL LOW (ref >1.3)
Pneumo Ab Type 19 (19F)*: 0.9 ug/mL — ABNORMAL LOW (ref >1.3)
Pneumo Ab Type 2*: 0.7 ug/mL — ABNORMAL LOW (ref >1.3)
Pneumo Ab Type 20*: 3 ug/mL (ref >1.3)
Pneumo Ab Type 22 (22F)*: 0.4 ug/mL — ABNORMAL LOW (ref >1.3)
Pneumo Ab Type 23 (23F)*: <0.1 ug/mL — ABNORMAL LOW (ref >1.3)
Pneumo Ab Type 26 (6B)*: 0.5 ug/mL — ABNORMAL LOW (ref >1.3)
Pneumo Ab Type 3*: 1.6 ug/mL (ref >1.3)
Pneumo Ab Type 34 (10A)*: 0.2 ug/mL — ABNORMAL LOW (ref >1.3)
Pneumo Ab Type 4*: <0.1 ug/mL — ABNORMAL LOW (ref >1.3)
Pneumo Ab Type 43 (11A)*: 0.2 ug/mL — ABNORMAL LOW (ref >1.3)
Pneumo Ab Type 5*: <0.1 ug/mL — ABNORMAL LOW (ref >1.3)
Pneumo Ab Type 51 (7F)*: <0.1 ug/mL — ABNORMAL LOW (ref >1.3)
Pneumo Ab Type 54 (15B)*: 0.4 ug/mL — ABNORMAL LOW (ref >1.3)
Pneumo Ab Type 56 (18C)*: >8.1 ug/mL (ref >1.3)
Pneumo Ab Type 57 (19A)*: 1.6 ug/mL (ref >1.3)
Pneumo Ab Type 68 (9V)*: <0.1 ug/mL — ABNORMAL LOW (ref >1.3)
Pneumo Ab Type 70 (33F)*: >10.1 ug/mL (ref >1.3)
Pneumo Ab Type 8*: 0.2 ug/mL — ABNORMAL LOW (ref >1.3)
Pneumo Ab Type 9 (9N)*: 1.8 ug/mL (ref >1.3)

## 2022-05-20 LAB — IGG, IGA, IGM
IgA/Immunoglobulin A, Serum: 217 mg/dL (ref 87–352)
IgG (Immunoglobin G), Serum: 1099 mg/dL (ref 586–1602)
IgM (Immunoglobulin M), Srm: 159 mg/dL (ref 26–217)

## 2022-05-20 LAB — DIPHTHERIA / TETANUS ANTIBODY PANEL
Diphtheria Ab: 0.51 IU/mL (ref <0.10)
Tetanus Ab, IgG: 1.22 IU/mL (ref <0.10)

## 2022-05-20 LAB — COMPLEMENT, TOTAL: Compl, Total (CH50): >60 U/mL (ref >41)

## 2022-05-23 ENCOUNTER — Ambulatory Visit
Admission: RE | Admit: 2022-05-23 | Discharge: 2022-05-23 | Disposition: A | Discharge status: Home / Self Care | Payer: 59 | Source: Ambulatory Visit | Attending: Family Medicine | Admitting: Family Medicine

## 2022-05-23 DIAGNOSIS — Z1231 Encounter for screening mammogram for malignant neoplasm of breast: Secondary | ICD-10-CM

## 2022-06-16 ENCOUNTER — Other Ambulatory Visit: Payer: Self-pay | Admitting: Family Medicine

## 2022-11-12 ENCOUNTER — Ambulatory Visit: Payer: 59 | Admitting: Family Medicine

## 2022-11-12 VITALS — BP 113/75 | HR 63 | Temp 97.9°F | Ht 62.01 in | Wt 143.2 lb

## 2022-11-12 DIAGNOSIS — J329 Chronic sinusitis, unspecified: Secondary | ICD-10-CM

## 2022-11-12 DIAGNOSIS — B999 Unspecified infectious disease: Secondary | ICD-10-CM | POA: Diagnosis not present

## 2022-11-12 DIAGNOSIS — B9689 Other specified bacterial agents as the cause of diseases classified elsewhere: Secondary | ICD-10-CM | POA: Diagnosis not present

## 2022-11-12 LAB — POC COVID19 BINAXNOW: SARS Coronavirus 2 Ag: NEGATIVE

## 2022-11-12 MED ORDER — DOXYCYCLINE HYCLATE 100 MG PO TABS: 100.0000 mg | ORAL_TABLET | Freq: Two times a day (BID) | ORAL | 0 refills | Status: DC

## 2022-11-12 MED ORDER — PREDNISONE 20 MG PO TABS: 20.0000 mg | ORAL_TABLET | Freq: Every day | ORAL | 0 refills | Status: DC

## 2022-11-12 NOTE — Progress Notes (Signed)
Sonya Clayton , [birth year], 42 y.o., female MRN: [id number] Patient Care Team    Relationship Specialty Notifications Start End  Natalia Leatherwood, DO PCP - General Family Medicine  11/20/17   Romualdo Bolk, MD (Inactive) Consulting Physician Obstetrics and Gynecology  01/03/20     Chief Complaint  Patient presents with   Headache    Pt has been having pressure off and on, has pain and pressure. Took Ibuprofen Sun morning. This morning started feeling feverish, took Ibuprofen .      Subjective: Sonya Clayton is a 42 y.o. Pt presents for an OV with complaints of headache and fever of 4 days  duration.  Associated symptoms include body aches, congestion and sore throat.  She also reports her ear started hurting.  She is taking her Flonase, but was not taking it routinely before onset.  She is not taking her antihistamine. She has had recurrent sinus infections last year, but has been sinus infection free for 8 months.      03/21/2022    1:47 PM 05/01/2021   10:35 AM 03/12/2021    1:33 PM 12/10/2020    1:38 PM 01/03/2020    8:35 AM  Depression screen PHQ 2/9  Decreased Interest 0 0 0 0 0  Down, Depressed, Hopeless 0 0 0 0 0  PHQ - 2 Score 0 0 0 0 0    Allergies  Allergen Reactions   Penicillins Hives   Social History   Social History Narrative   Marital status/children/pets: married, 2 children   Education/employment: B.S. Electrical eng.    Safety:      -Wears a bicycle helmet riding a bike: No     -smoke alarm in the home:Yes     - wears seatbelt: Yes     - Feels safe in their relationships: Yes   Past Medical History:  Diagnosis Date   Chicken pox    GERD (gastroesophageal reflux disease)    Gestational diabetes    Infertility, female    Thyroid disease    Vegetarian diet    Past Surgical History:  Procedure Laterality Date   NO PAST SURGERIES     WISDOM TOOTH EXTRACTION     Family History  Problem Relation Age of Onset   Hypertension Mother     Hypertension Father    Breast cancer Paternal Grandmother    Diabetes Paternal Grandmother    Hypertension Paternal Grandmother    Hypertension Paternal Grandfather    Allergies as of 11/12/2022       Reactions   Penicillins Hives        Medication List        Accurate as of November 12, 2022  3:28 PM. If you have any questions, ask your nurse or doctor.          Biotin 32440 MCG Tabs Take 1 tablet by mouth daily.   doxycycline 100 MG tablet Commonly known as: VIBRA-TABS Take 1 tablet (100 mg total) by mouth 2 (two) times daily.   fluticasone 50 MCG/ACT nasal spray Commonly known as: FLONASE SPRAY 2 SPRAYS INTO EACH NOSTRIL EVERY DAY   multivitamin capsule Take 1 capsule by mouth daily.   pantoprazole 40 MG tablet Commonly known as: PROTONIX Take 1 tablet (40 mg total) by mouth daily.   predniSONE 20 MG tablet Commonly known as: DELTASONE Take 1 tablet (20 mg total) by mouth daily with breakfast.   Vitamin D3 50 MCG (2000 UT) Tabs Take  1 tablet by mouth daily.        All past medical history, surgical history, allergies, family history, immunizations andmedications were updated in the EMR today and reviewed under the history and medication portions of their EMR.     Review of Systems  Constitutional:  Positive for fever and malaise/fatigue. Negative for chills.  HENT:  Positive for ear pain, sinus pain and sore throat.   Respiratory:  Negative for cough, sputum production, shortness of breath and wheezing.   Musculoskeletal:  Positive for myalgias.  Skin:  Negative for rash.  Neurological:  Positive for headaches.   Negative, with the exception of above mentioned in HPI   Objective:  BP 113/75   Pulse 63   Temp 97.9 F (36.6 C)   Ht 5' 2.01" (1.575 m)   Wt 143 lb 3.2 oz (65 kg)   SpO2 97%   BMI 26.18 kg/m  Body mass index is 26.18 kg/m. Physical Exam Vitals and nursing note reviewed.  Constitutional:      General: She is not in acute  distress.    Appearance: Normal appearance. She is not ill-appearing, toxic-appearing or diaphoretic.  HENT:     Head: Normocephalic and atraumatic.     Right Ear: Tympanic membrane and ear canal normal.     Left Ear: Tympanic membrane and ear canal normal.     Nose: Congestion present. No rhinorrhea.     Right Turbinates: Enlarged and swollen.     Left Turbinates: Enlarged and swollen.     Right Sinus: Frontal sinus tenderness present.     Left Sinus: Frontal sinus tenderness present.     Mouth/Throat:     Mouth: Mucous membranes are moist.     Pharynx: No oropharyngeal exudate or posterior oropharyngeal erythema.  Eyes:     General: No scleral icterus.       Right eye: No discharge.        Left eye: No discharge.     Extraocular Movements: Extraocular movements intact.     Conjunctiva/sclera: Conjunctivae normal.     Pupils: Pupils are equal, round, and reactive to light.  Cardiovascular:     Rate and Rhythm: Normal rate and regular rhythm.  Pulmonary:     Effort: Pulmonary effort is normal. No respiratory distress.     Breath sounds: Normal breath sounds. No wheezing, rhonchi or rales.  Musculoskeletal:     Cervical back: Neck supple. No tenderness.     Right lower leg: No edema.     Left lower leg: No edema.  Lymphadenopathy:     Cervical: No cervical adenopathy.  Skin:    General: Skin is warm.     Findings: No rash.  Neurological:     Mental Status: She is alert and oriented to person, place, and time. Mental status is at baseline.     Motor: No weakness.     Gait: Gait normal.  Psychiatric:        Mood and Affect: Mood normal.        Behavior: Behavior normal.        Thought Content: Thought content normal.        Judgment: Judgment normal.     No results found. No results found. Results for orders placed or performed in visit on 11/12/22 (from the past 24 hour(s))  POC COVID-19 BinaxNow     Status: None   Collection Time: 11/12/22  3:24 PM  Result Value Ref  Range   SARS Coronavirus 2  Ag Negative Negative    Assessment/Plan: [name] is a 42 y.o. female present for OV for  Bacterial sinusitis: .Rest, hydrate.  Continue flonase,nettie pot or nasal saline.  Restart xyzal Doxy bid and low dose pred prescribed, take until completed.  Covid -  Reviewed expectations re: course of current medical issues. Discussed self-management of symptoms. Outlined signs and symptoms indicating need for more acute intervention. Patient verbalized understanding and all questions were answered. Patient received an After-Visit Summary.    Orders Placed This Encounter  Procedures   POC COVID-19 BinaxNow   Meds ordered this encounter  Medications   predniSONE (DELTASONE) 20 MG tablet    Sig: Take 1 tablet (20 mg total) by mouth daily with breakfast.    Dispense:  5 tablet    Refill:  0   doxycycline (VIBRA-TABS) 100 MG tablet    Sig: Take 1 tablet (100 mg total) by mouth 2 (two) times daily.    Dispense:  20 tablet    Refill:  0   Referral Orders  No referral(s) requested today     Note is dictated utilizing voice recognition software. Although note has been proof read prior to signing, occasional typographical errors still can be missed. If any questions arise, please do not hesitate to call for verification.   electronically signed by:  Felix Pacini, DO  [facility] [place]

## 2023-02-19 IMAGING — MG MM DIGITAL SCREENING BILAT W/ TOMO AND CAD
6 of 10 series · 6 of 30 positions shown · non-contrast
Comparison: Previous exam(s).

CLINICAL DATA: Screening.

EXAM:
DIGITAL SCREENING BILATERAL MAMMOGRAM WITH TOMOSYNTHESIS AND CAD
TECHNIQUE: Bilateral screening digital craniocaudal and mediolateral oblique
mammograms were obtained. Bilateral screening digital breast
tomosynthesis was performed. The images were evaluated with
computer-aided detection.

[L CC synth-2D (1 of 2)]
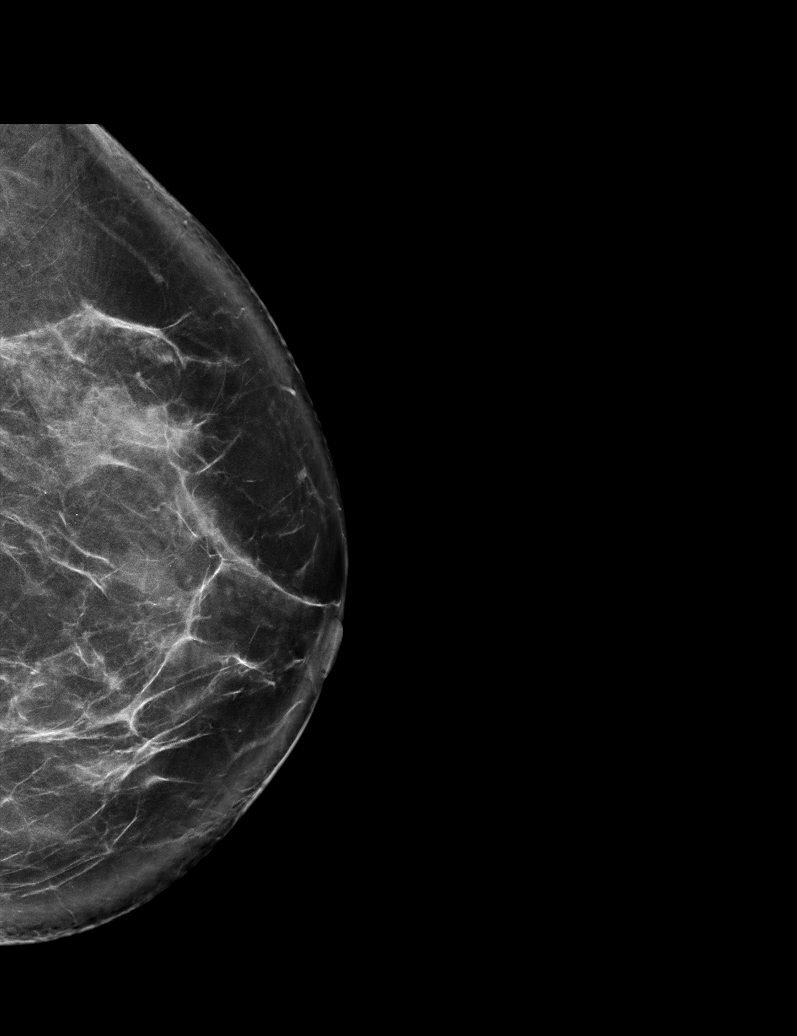

[L CC synth-2D (2 of 2)]
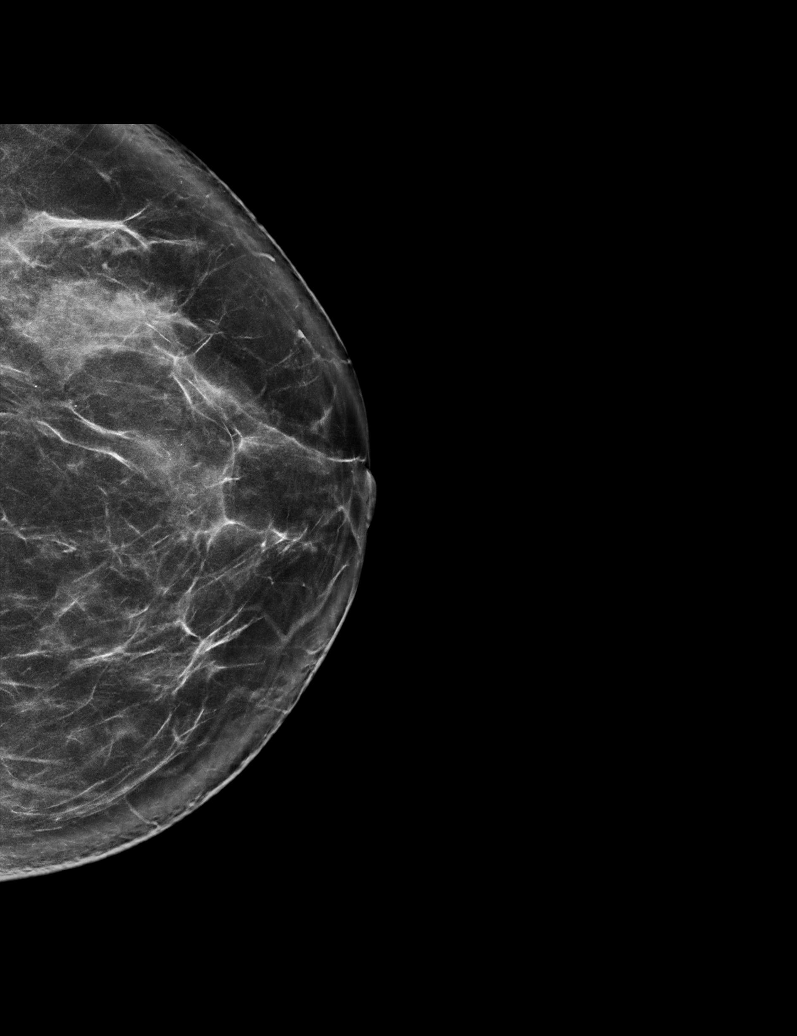

[L MLO synth-2D]
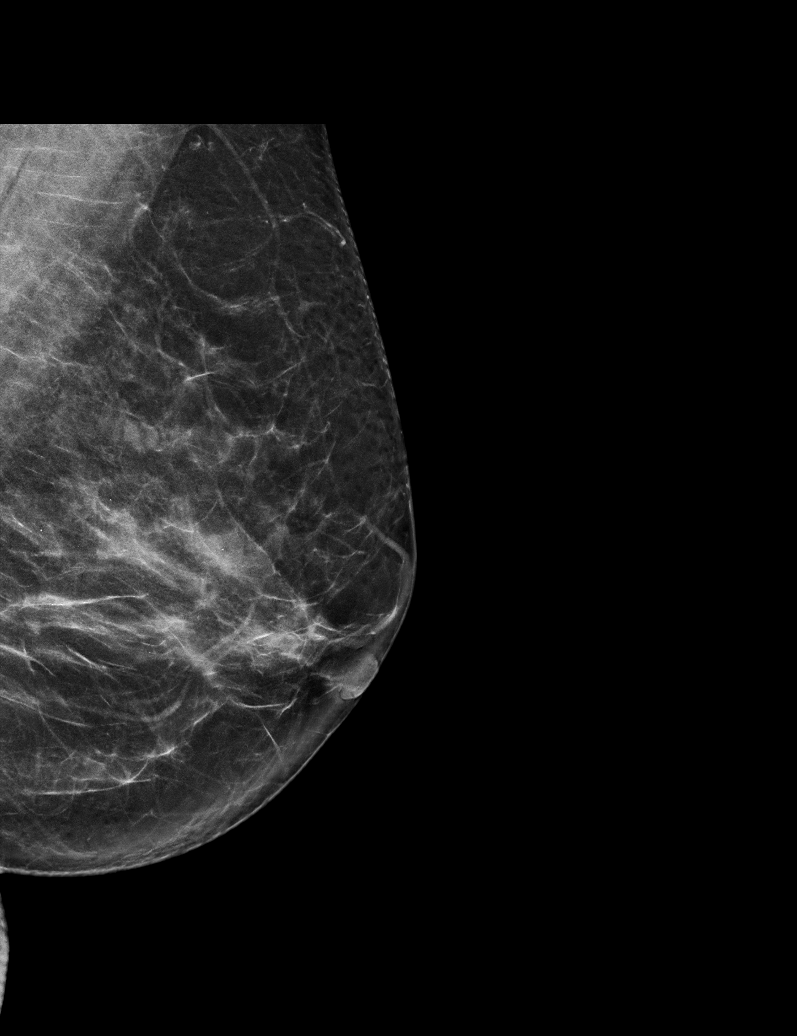

[R CC synth-2D]
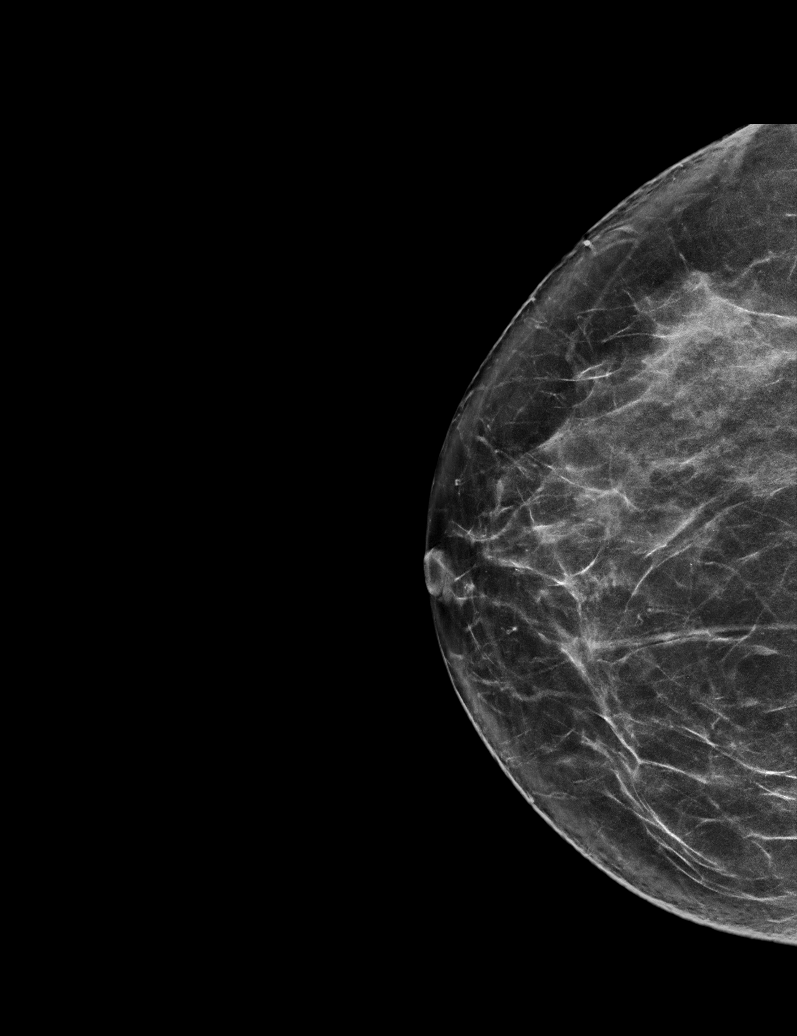

[R MLO synth-2D]
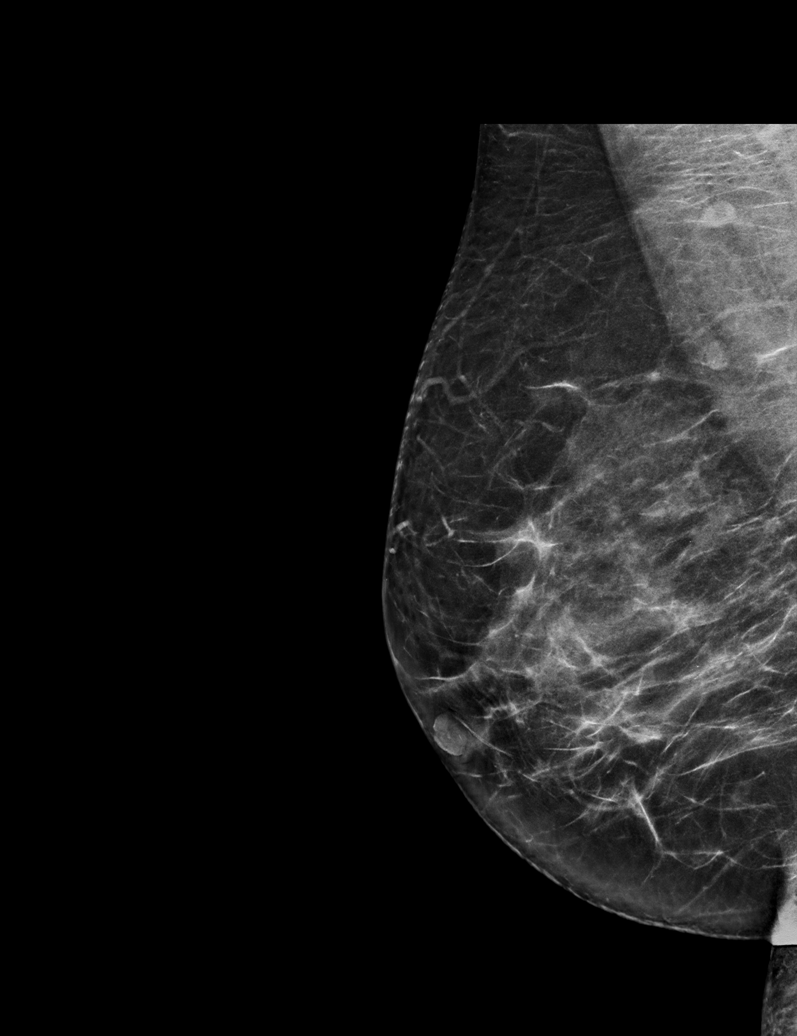

[L CC tomo · tomo slice 35/70.0]
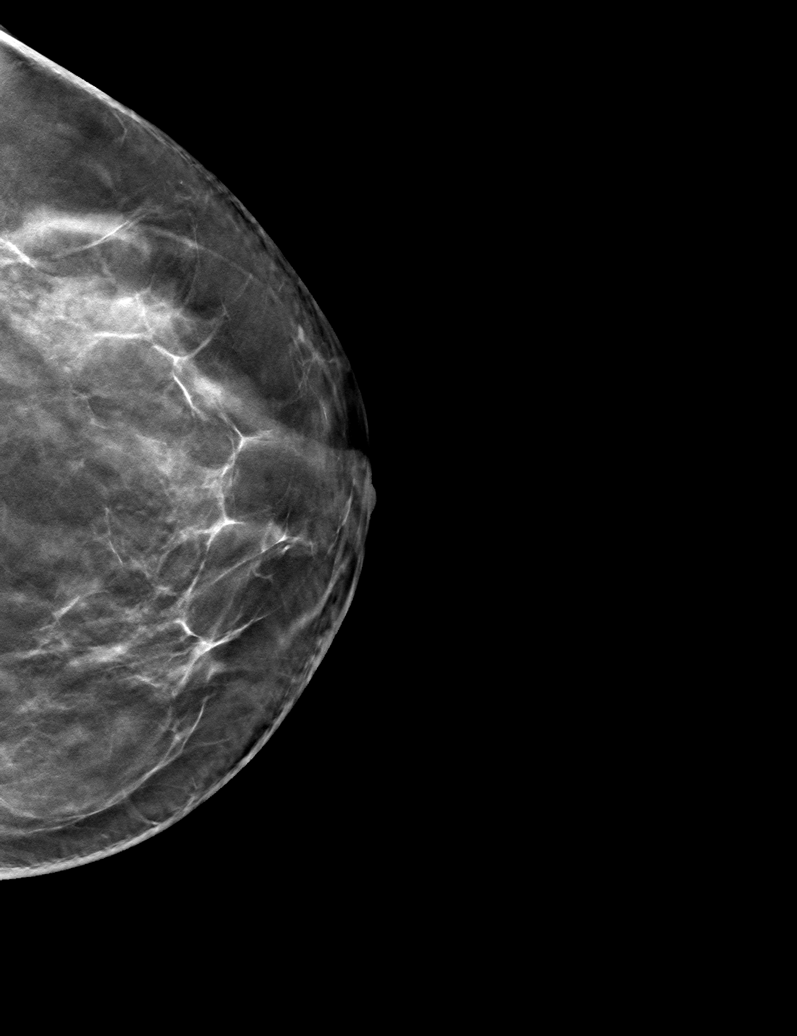

[6 of 30 positions shown; findings below may reference images not displayed]

ACR Breast Density Category c: The breast tissue is heterogeneously
dense, which may obscure small masses.
FINDINGS: There are no findings suspicious for malignancy.
IMPRESSION: No mammographic evidence of malignancy. A result letter of this
screening mammogram will be mailed directly to the patient.

RECOMMENDATION:
Screening mammogram in one year. (Code:Q3-W-BC3)

BI-RADS CATEGORY  1: Negative.

## 2023-03-24 ENCOUNTER — Encounter: Payer: Self-pay | Admitting: Family Medicine

## 2023-03-24 ENCOUNTER — Ambulatory Visit: Payer: 59 | Admitting: Family Medicine

## 2023-03-24 VITALS — BP 112/72 | HR 68 | Temp 98.1°F | Wt 142.6 lb

## 2023-03-24 DIAGNOSIS — R0981 Nasal congestion: Secondary | ICD-10-CM | POA: Diagnosis not present

## 2023-03-24 DIAGNOSIS — J3489 Other specified disorders of nose and nasal sinuses: Secondary | ICD-10-CM | POA: Diagnosis not present

## 2023-03-24 DIAGNOSIS — B9689 Other specified bacterial agents as the cause of diseases classified elsewhere: Secondary | ICD-10-CM

## 2023-03-24 DIAGNOSIS — J329 Chronic sinusitis, unspecified: Secondary | ICD-10-CM

## 2023-03-24 LAB — POC INFLUENZA A&B (BINAX/QUICKVUE)
Influenza A, POC: NEGATIVE
Influenza B, POC: NEGATIVE

## 2023-03-24 LAB — POC COVID19 BINAXNOW: SARS Coronavirus 2 Ag: NEGATIVE

## 2023-03-24 MED ORDER — PREDNISONE 20 MG PO TABS: 20.0000 mg | ORAL_TABLET | Freq: Every day | ORAL | 0 refills | Status: DC

## 2023-03-24 MED ORDER — AZITHROMYCIN 250 MG PO TABS: ORAL_TABLET | ORAL | 0 refills | Status: AC

## 2023-03-24 NOTE — Patient Instructions (Addendum)

## 2023-03-24 NOTE — Progress Notes (Signed)
Sonya Clayton , 04/02/1981, 42 y.o., female MRN: [id number] Patient Care Team    Relationship Specialty Notifications Start End  Natalia Leatherwood, DO PCP - General Family Medicine  11/20/17   Romualdo Bolk, MD (Inactive) Consulting Physician Obstetrics and Gynecology  01/03/20     Chief Complaint  Patient presents with   Nasal Congestion    Viral infection 11/20; new sinus pressure started thursday     Subjective: Sonya Clayton is a 42 y.o. Pt presents for an OV with complaints of nasal congestion of 6 days duration.  Associated symptoms include sinus pressure. He reports a viral infection 11/20, which completely resolved until last week symptoms returned.  Allergic to PCN Omnicef did not help her last year Doxycyline took "3-4 days to work" for in July, but did treat appropriately.  Pt has tried Flonase to ease their symptoms.      03/21/2022    1:47 PM 05/01/2021   10:35 AM 03/12/2021    1:33 PM 12/10/2020    1:38 PM 01/03/2020    8:35 AM  Depression screen PHQ 2/9  Decreased Interest 0 0 0 0 0  Down, Depressed, Hopeless 0 0 0 0 0  PHQ - 2 Score 0 0 0 0 0    Allergies  Allergen Reactions   Penicillins Hives   Social History   Social History Narrative   Marital status/children/pets: married, 2 children   Education/employment: B.S. Electrical eng.    Safety:      -Wears a bicycle helmet riding a bike: No     -smoke alarm in the home:Yes     - wears seatbelt: Yes     - Feels safe in their relationships: Yes   Past Medical History:  Diagnosis Date   Chicken pox    GERD (gastroesophageal reflux disease)    Gestational diabetes    Infertility, female    Thyroid disease    Vegetarian diet    Past Surgical History:  Procedure Laterality Date   NO PAST SURGERIES     WISDOM TOOTH EXTRACTION     Family History  Problem Relation Age of Onset   Hypertension Mother    Hypertension Father    Breast cancer Paternal Grandmother    Diabetes Paternal  Grandmother    Hypertension Paternal Grandmother    Hypertension Paternal Grandfather    Allergies as of 03/24/2023       Reactions   Penicillins Hives        Medication List        Accurate as of March 24, 2023 12:05 PM. If you have any questions, ask your nurse or doctor.          azithromycin 250 MG tablet Commonly known as: ZITHROMAX Take 2 tablets on day 1, then 1 tablet daily on days 2 through 5 Started by: Felix Pacini   Biotin 40981 MCG Tabs Take 1 tablet by mouth daily.   doxycycline 100 MG tablet Commonly known as: VIBRA-TABS Take 1 tablet (100 mg total) by mouth 2 (two) times daily.   fluticasone 50 MCG/ACT nasal spray Commonly known as: FLONASE SPRAY 2 SPRAYS INTO EACH NOSTRIL EVERY DAY   multivitamin capsule Take 1 capsule by mouth daily.   pantoprazole 40 MG tablet Commonly known as: PROTONIX Take 1 tablet (40 mg total) by mouth daily.   predniSONE 20 MG tablet Commonly known as: DELTASONE Take 1 tablet (20 mg total) by mouth daily with breakfast.   Vitamin  D3 50 MCG (2000 UT) Tabs Take 1 tablet by mouth daily.        All past medical history, surgical history, allergies, family history, immunizations andmedications were updated in the EMR today and reviewed under the history and medication portions of their EMR.     Review of Systems  Constitutional:  Positive for malaise/fatigue. Negative for chills and fever.  HENT:  Positive for congestion, ear pain, sinus pain and sore throat.   Eyes:  Negative for pain and discharge.  Respiratory:  Negative for cough, sputum production, shortness of breath and wheezing.   Gastrointestinal:  Negative for diarrhea, nausea and vomiting.  Musculoskeletal:  Negative for myalgias.  Neurological:  Positive for headaches. Negative for dizziness.   Negative, with the exception of above mentioned in HPI   Objective:  BP 112/72   Pulse 68   Temp 98.1 F (36.7 C)   Wt 142 lb 9.6 oz (64.7 kg)    SpO2 98%   BMI 26.07 kg/m  Body mass index is 26.07 kg/m. Physical Exam Vitals and nursing note reviewed.  Constitutional:      General: She is not in acute distress.    Appearance: Normal appearance. She is normal weight. She is not ill-appearing or toxic-appearing.  HENT:     Head: Normocephalic and atraumatic.     Right Ear: Tympanic membrane and ear canal normal.     Left Ear: Tympanic membrane and ear canal normal.     Ears:     Comments: Mild b/l ear effusion    Nose: Congestion and rhinorrhea present.     Right Turbinates: Enlarged and swollen.     Left Turbinates: Enlarged and swollen.     Right Sinus: Maxillary sinus tenderness and frontal sinus tenderness present.     Left Sinus: Maxillary sinus tenderness and frontal sinus tenderness present.     Mouth/Throat:     Mouth: Mucous membranes are moist.     Pharynx: No oropharyngeal exudate or posterior oropharyngeal erythema.  Eyes:     General: No scleral icterus.       Right eye: No discharge.        Left eye: No discharge.     Extraocular Movements: Extraocular movements intact.     Conjunctiva/sclera: Conjunctivae normal.     Pupils: Pupils are equal, round, and reactive to light.  Cardiovascular:     Rate and Rhythm: Normal rate and regular rhythm.  Pulmonary:     Effort: Pulmonary effort is normal. No respiratory distress.     Breath sounds: Normal breath sounds. No wheezing, rhonchi or rales.  Musculoskeletal:     Right lower leg: No edema.     Left lower leg: No edema.  Skin:    Findings: No rash.  Neurological:     Mental Status: She is alert and oriented to person, place, and time. Mental status is at baseline.     Motor: No weakness.     Coordination: Coordination normal.     Gait: Gait normal.  Psychiatric:        Mood and Affect: Mood normal.        Behavior: Behavior normal.        Thought Content: Thought content normal.        Judgment: Judgment normal.     No results found. No results  found. Results for orders placed or performed in visit on 03/24/23 (from the past 24 hour(s))  POC COVID-19 BinaxNow     Status: None  Collection Time: 03/24/23 12:02 PM  Result Value Ref Range   SARS Coronavirus 2 Ag Negative Negative  POC Influenza A&B (Binax test)     Status: None   Collection Time: 03/24/23 12:02 PM  Result Value Ref Range   Influenza A, POC Negative Negative   Influenza B, POC Negative Negative    Assessment/Plan: Sonya Clayton is a 42 y.o. female present for OV for  Complaint of nasal congestion - POC COVID-19 BinaxNow - POC Influenza A&B (Binax test)  Sinus pain/Bacterial sinusitis Covid and flu test today: negative Rest, hydrate.  +/- flonase, mucinex (DM if cough), nettie pot or nasal saline.  Azith and low dose predx5 prescribed, take until completed.  If cough present it can last up to 6-8 weeks.  F/U 2 weeks if not improved.   Reviewed expectations re: course of current medical issues. Discussed self-management of symptoms. Outlined signs and symptoms indicating need for more acute intervention. Patient verbalized understanding and all questions were answered. Patient received an After-Visit Summary.    Orders Placed This Encounter  Procedures   POC COVID-19 BinaxNow   POC Influenza A&B (Binax test)   Meds ordered this encounter  Medications   azithromycin (ZITHROMAX) 250 MG tablet    Sig: Take 2 tablets on day 1, then 1 tablet daily on days 2 through 5    Dispense:  6 tablet    Refill:  0   predniSONE (DELTASONE) 20 MG tablet    Sig: Take 1 tablet (20 mg total) by mouth daily with breakfast.    Dispense:  5 tablet    Refill:  0   Referral Orders  No referral(s) requested today     Note is dictated utilizing voice recognition software. Although note has been proof read prior to signing, occasional typographical errors still can be missed. If any questions arise, please do not hesitate to call for verification.   electronically  signed by:  Felix Pacini, DO  [facility] - [place]

## 2023-03-31 ENCOUNTER — Encounter: Payer: Self-pay | Admitting: Family Medicine

## 2023-03-31 ENCOUNTER — Ambulatory Visit: Payer: 59 | Admitting: Family Medicine

## 2023-03-31 VITALS — BP 102/68 | HR 72 | Temp 97.4°F | Wt 144.2 lb

## 2023-03-31 DIAGNOSIS — R519 Headache, unspecified: Secondary | ICD-10-CM | POA: Diagnosis not present

## 2023-03-31 DIAGNOSIS — R0981 Nasal congestion: Secondary | ICD-10-CM

## 2023-03-31 MED ORDER — LEVOCETIRIZINE DIHYDROCHLORIDE 5 MG PO TABS: 5.0000 mg | ORAL_TABLET | Freq: Every evening | ORAL | 11 refills | Status: DC

## 2023-03-31 NOTE — Progress Notes (Signed)
Sonya Clayton , [birth year], 42 y.o., female MRN: [id number] Patient Care Team    Relationship Specialty Notifications Start End  Natalia Leatherwood, DO PCP - General Family Medicine  11/20/17   Romualdo Bolk, MD (Inactive) Consulting Physician Obstetrics and Gynecology  01/03/20     Chief Complaint  Patient presents with   Headache    Completed Abx and felt better but still having discomfort in sinus area with HA and ear pian     Subjective: Sonya Clayton is a 42 y.o. Pt presents for an OV with complaints of continued sins pressure and headache.She was seen last week and treated for a sinus infection with prednisone x5 days and azith.   Prior note: nasal congestion of 6 days duration.  Associated symptoms include sinus pressure. She reports a viral infection 11/20, which completely resolved until last week symptoms returned.  Allergic to PCN Omnicef did not help her last year Doxycyline took "3-4 days to work" for in July, but did treat appropriately.  Pt has tried Flonase to ease their symptoms.      03/21/2022    1:47 PM 05/01/2021   10:35 AM 03/12/2021    1:33 PM 12/10/2020    1:38 PM 01/03/2020    8:35 AM  Depression screen PHQ 2/9  Decreased Interest 0 0 0 0 0  Down, Depressed, Hopeless 0 0 0 0 0  PHQ - 2 Score 0 0 0 0 0    Allergies  Allergen Reactions   Penicillins Hives   Social History   Social History Narrative   Marital status/children/pets: married, 2 children   Education/employment: B.S. Electrical eng.    Safety:      -Wears a bicycle helmet riding a bike: No     -smoke alarm in the home:Yes     - wears seatbelt: Yes     - Feels safe in their relationships: Yes   Past Medical History:  Diagnosis Date   Chicken pox    GERD (gastroesophageal reflux disease)    Gestational diabetes    Infertility, female    Thyroid disease    Vegetarian diet    Past Surgical History:  Procedure Laterality Date   NO PAST SURGERIES     WISDOM TOOTH  EXTRACTION     Family History  Problem Relation Age of Onset   Hypertension Mother    Hypertension Father    Breast cancer Paternal Grandmother    Diabetes Paternal Grandmother    Hypertension Paternal Grandmother    Hypertension Paternal Grandfather    Allergies as of 03/31/2023       Reactions   Penicillins Hives        Medication List        Accurate as of March 31, 2023  2:37 PM. If you have any questions, ask your nurse or doctor.          STOP taking these medications    doxycycline 100 MG tablet Commonly known as: VIBRA-TABS Stopped by: Felix Pacini   predniSONE 20 MG tablet Commonly known as: DELTASONE Stopped by: Felix Pacini       TAKE these medications    Biotin 78295 MCG Tabs Take 1 tablet by mouth daily.   fluticasone 50 MCG/ACT nasal spray Commonly known as: FLONASE SPRAY 2 SPRAYS INTO EACH NOSTRIL EVERY DAY   levocetirizine 5 MG tablet Commonly known as: XYZAL Take 1 tablet (5 mg total) by mouth every evening. Started by: Felix Pacini  multivitamin capsule Take 1 capsule by mouth daily.   pantoprazole 40 MG tablet Commonly known as: PROTONIX Take 1 tablet (40 mg total) by mouth daily.   Vitamin D3 50 MCG (2000 UT) Tabs Take 1 tablet by mouth daily.        All past medical history, surgical history, allergies, family history, immunizations andmedications were updated in the EMR today and reviewed under the history and medication portions of their EMR.     Review of Systems  Constitutional:  Positive for malaise/fatigue. Negative for chills and fever.  HENT:  Positive for congestion, ear pain, sinus pain and sore throat.   Eyes:  Negative for pain and discharge.  Respiratory:  Negative for cough, sputum production, shortness of breath and wheezing.   Gastrointestinal:  Negative for diarrhea, nausea and vomiting.  Musculoskeletal:  Negative for myalgias.  Neurological:  Positive for headaches. Negative for dizziness.    Negative, with the exception of above mentioned in HPI   Objective:  BP 102/68   Pulse 72   Temp (!) 97.4 F (36.3 C)   Wt 144 lb 3.2 oz (65.4 kg)   SpO2 97%   BMI 26.37 kg/m  Body mass index is 26.37 kg/m. Physical Exam Vitals and nursing note reviewed.  Constitutional:      General: She is not in acute distress.    Appearance: Normal appearance. She is normal weight. She is not ill-appearing or toxic-appearing.  HENT:     Head: Normocephalic and atraumatic.     Right Ear: Tympanic membrane and ear canal normal.     Left Ear: Ear canal normal.     Ears:     Comments: Mild left ear effusion    Nose: No congestion or rhinorrhea.     Right Turbinates: Swollen. Not enlarged.     Left Turbinates: Not enlarged or swollen.     Right Sinus: Maxillary sinus tenderness and frontal sinus tenderness present.     Left Sinus: Maxillary sinus tenderness and frontal sinus tenderness present.     Mouth/Throat:     Mouth: Mucous membranes are moist.     Pharynx: No oropharyngeal exudate or posterior oropharyngeal erythema.  Eyes:     General: No scleral icterus.       Right eye: No discharge.        Left eye: No discharge.     Extraocular Movements: Extraocular movements intact.     Conjunctiva/sclera: Conjunctivae normal.     Pupils: Pupils are equal, round, and reactive to light.  Musculoskeletal:     Right lower leg: No edema.     Left lower leg: No edema.  Skin:    Findings: No rash.  Neurological:     Mental Status: She is alert and oriented to person, place, and time. Mental status is at baseline.     Motor: No weakness.     Coordination: Coordination normal.     Gait: Gait normal.  Psychiatric:        Mood and Affect: Mood normal.        Behavior: Behavior normal.        Thought Content: Thought content normal.        Judgment: Judgment normal.     No results found. No results found. No results found for this or any previous visit (from the past 24  hours).   Assessment/Plan: Sonya Clayton is a 42 y.o. female present for OV for  Sinus pain/Bacterial sinusitis Exam has shown great improvement, with  mild right sided sinusitis present.  No infection appears present today.  Start xyzal at bedtime Start nasal navage system 3-7 days week as tolerated.  Continue flonase every day.    Reviewed expectations re: course of current medical issues. Discussed self-management of symptoms. Outlined signs and symptoms indicating need for more acute intervention. Patient verbalized understanding and all questions were answered. Patient received an After-Visit Summary.    No orders of the defined types were placed in this encounter.  Meds ordered this encounter  Medications   levocetirizine (XYZAL) 5 MG tablet    Sig: Take 1 tablet (5 mg total) by mouth every evening.    Dispense:  30 tablet    Refill:  11   Referral Orders  No referral(s) requested today     Note is dictated utilizing voice recognition software. Although note has been proof read prior to signing, occasional typographical errors still can be missed. If any questions arise, please do not hesitate to call for verification.   electronically signed by:  Felix Pacini, DO  [facility] - [place]

## 2023-03-31 NOTE — Patient Instructions (Addendum)
Purchase a nasal navage system and use daily to 3x a week.  Continue flonase Xyzal- prescribed, also can buy over the counter , take before bed nightly   Return in about 2 weeks (around 04/14/2023).        Great to see you today.  I have refilled the medication(s) we provide.   If labs were collected or images ordered, we will inform you of  results once we have received them and reviewed. We will contact you either by echart message, or telephone call.  Please give ample time to the testing facility, and our office to run,  receive and review results. Please do not call inquiring of results, even if you can see them in your chart. We will contact you as soon as we are able. If it has been over 1 week since the test was completed, and you have not yet heard from [place], then please call us.    - echart message- for normal results that have been seen by the patient already.   - telephone call: abnormal results or if patient has not viewed results in their echart.  If a referral to a specialist was entered for you, please call us in 2 weeks if you have not heard from the specialist office to schedule.

## 2023-05-05 ENCOUNTER — Encounter: Payer: Self-pay | Admitting: Family Medicine

## 2023-05-05 ENCOUNTER — Ambulatory Visit: Payer: 59 | Admitting: Family Medicine

## 2023-05-05 VITALS — BP 116/76 | HR 67 | Temp 97.6°F | Ht 62.0 in | Wt 142.2 lb

## 2023-05-05 DIAGNOSIS — Z23 Encounter for immunization: Secondary | ICD-10-CM

## 2023-05-05 DIAGNOSIS — Z0000 Encounter for general adult medical examination without abnormal findings: Secondary | ICD-10-CM

## 2023-05-05 DIAGNOSIS — Z789 Other specified health status: Secondary | ICD-10-CM

## 2023-05-05 DIAGNOSIS — Z1231 Encounter for screening mammogram for malignant neoplasm of breast: Secondary | ICD-10-CM | POA: Diagnosis not present

## 2023-05-05 DIAGNOSIS — K219 Gastro-esophageal reflux disease without esophagitis: Secondary | ICD-10-CM

## 2023-05-05 DIAGNOSIS — Z79899 Other long term (current) drug therapy: Secondary | ICD-10-CM

## 2023-05-05 LAB — CBC
HCT: 41.3 % (ref 36.0–46.0)
Hemoglobin: 14.1 g/dL (ref 12.0–15.0)
MCHC: 34.2 g/dL (ref 30.0–36.0)
MCV: 87.1 fL (ref 78.0–100.0)
Platelets: 274 10*3/uL (ref 150.0–400.0)
RBC: 4.73 Mil/uL (ref 3.87–5.11)
RDW: 13 % (ref 11.5–15.5)
WBC: 9.5 10*3/uL (ref 4.0–10.5)

## 2023-05-05 LAB — COMPREHENSIVE METABOLIC PANEL
ALT: 17 U/L (ref 0–35)
AST: 20 U/L (ref 0–37)
Albumin: 4.8 g/dL (ref 3.5–5.2)
Alkaline Phosphatase: 52 U/L (ref 39–117)
BUN: 8 mg/dL (ref 6–23)
CO2: 26 meq/L (ref 19–32)
Calcium: 9.8 mg/dL (ref 8.4–10.5)
Chloride: 105 meq/L (ref 96–112)
Creatinine, Ser: 0.61 mg/dL (ref 0.40–1.20)
GFR: 109.56 mL/min (ref >60.00)
Glucose, Bld: 70 mg/dL (ref 70–99)
Potassium: 4.5 meq/L (ref 3.5–5.1)
Sodium: 137 meq/L (ref 135–145)
Total Bilirubin: 0.4 mg/dL (ref 0.2–1.2)
Total Protein: 7.7 g/dL (ref 6.0–8.3)

## 2023-05-05 LAB — TSH: TSH: 3.13 u[IU]/mL (ref 0.35–5.50)

## 2023-05-05 NOTE — Patient Instructions (Addendum)
Return in about 1 year (around 05/05/2024) for cpe (20 min).        Great to see you today.  I have refilled the medication(s) we provide.   If labs were collected or images ordered, we will inform you of  results once we have received them and reviewed. We will contact you either by echart message, or telephone call.  Please give ample time to the testing facility, and our office to run,  receive and review results. Please do not call inquiring of results, even if you can see them in your chart. We will contact you as soon as we are able. If it has been over 1 week since the test was completed, and you have not yet heard from [place], then please call us.    - echart message- for normal results that have been seen by the patient already.   - telephone call: abnormal results or if patient has not viewed results in their echart.  If a referral to a specialist was entered for you, please call us in 2 weeks if you have not heard from the specialist office to schedule.

## 2023-05-05 NOTE — Progress Notes (Signed)
Patient ID: Sonya Clayton, female  DOB: 1980/09/18, 43 y.o.   MRN: [id number] Patient Care Team    Relationship Specialty Notifications Start End  Sonya Leatherwood, DO PCP - General Family Medicine  11/20/17   Sonya Bolk, MD (Inactive) Consulting Physician Obstetrics and Gynecology  01/03/20   Spainhour, Sonya Clayton, [place]  Otolaryngology  05/05/23     Chief Complaint  Patient presents with   Annual Exam    Pt is not fasting;CMC    Subjective: Sonya Clayton is a 43 y.o.  Female  present for CPE All past medical history, surgical history, allergies, family history, immunizations, medications and social history were updated in the electronic medical record today. All recent labs, ED visits and hospitalizations within the last year were reviewed.  Health maintenance:  Colon cancer screen: no fhx. Routine screen at 44 Mammogram: 05/23/2022- bcgso. PGM with breast cancer.  Cervical cancer screening: last pap: 2023 per pt. No prior abnormal. Uncertain if co-test preformed. Establish with gynecology Immunizations: tdap 04/2014 UTD, Influenza-completed today,  COVID vaccinated Infectious disease screening: HIV completed 2016, HEP c completed DEXA:routine screen. Patient has a Dental home. Hospitalizations/ED visits: reviewed     05/05/2023   10:45 AM 03/21/2022    1:47 PM 05/01/2021   10:35 AM 03/12/2021    1:33 PM 12/10/2020    1:38 PM  Depression screen PHQ 2/9  Decreased Interest 0 0 0 0 0  Down, Depressed, Hopeless 0 0 0 0 0  PHQ - 2 Score 0 0 0 0 0      05/01/2021   10:35 AM  GAD 7 : Generalized Anxiety Score  Nervous, Anxious, on Edge 0  Control/stop worrying 0  Worry too much - different things 0  Trouble relaxing 0  Restless 0  Easily annoyed or irritable 0  Afraid - awful might happen 0  Total GAD 7 Score 0  Anxiety Difficulty Not difficult at all   Immunization History  Administered Date(s) Administered   Influenza, Seasonal, Injecte, Preservative Fre  05/05/2023   Influenza,inj,Quad PF,6+ Mos 03/05/2018, 12/15/2018, 01/03/2020, 05/02/2022   PFIZER(Purple Top)SARS-COV-2 Vaccination 07/26/2019, 08/16/2019, 03/04/2020   Tdap 04/14/2014   Past Medical History:  Diagnosis Date   Chicken pox    GERD (gastroesophageal reflux disease)    Gestational diabetes    Infertility, female    Thyroid disease    Vegetarian diet    Allergies  Allergen Reactions   Penicillins Hives   Past Surgical History:  Procedure Laterality Date   NO PAST SURGERIES     WISDOM TOOTH EXTRACTION     Family History  Problem Relation Age of Onset   Hypertension Mother    Hypertension Father    Breast cancer Paternal Grandmother    Diabetes Paternal Grandmother    Hypertension Paternal Grandmother    Hypertension Paternal Grandfather    Social History   Social History Narrative   Marital status/children/pets: married, 2 children   Education/employment: B.S. Electrical eng.    Safety:      -Wears a bicycle helmet riding a bike: No     -smoke alarm in the home:Yes     - wears seatbelt: Yes     - Feels safe in their relationships: Yes    Allergies as of 05/05/2023       Reactions   Penicillins Hives        Medication List        Accurate as of May 05, 2023 11:02 AM.  If you have any questions, ask your nurse or doctor.          STOP taking these medications    Biotin 02725 MCG Tabs Stopped by: Felix Pacini       TAKE these medications    fluticasone 50 MCG/ACT nasal spray Commonly known as: FLONASE SPRAY 2 SPRAYS INTO EACH NOSTRIL EVERY DAY   levocetirizine 5 MG tablet Commonly known as: XYZAL Take 1 tablet (5 mg total) by mouth every evening.   multivitamin capsule Take 1 capsule by mouth daily.   pantoprazole 40 MG tablet Commonly known as: PROTONIX Take 1 tablet (40 mg total) by mouth daily.   Vitamin D3 50 MCG (2000 UT) Tabs Take 1 tablet by mouth daily.        All past medical history, surgical history,  allergies, family history, immunizations andmedications were updated in the EMR today and reviewed under the history and medication portions of their EMR.      ROS 14 pt review of systems performed and negative (unless mentioned in an HPI)  Objective: BP 116/76   Pulse 67   Temp 97.6 F (36.4 C)   Ht 5\' 2"  (1.575 m)   Wt 142 lb 3.2 oz (64.5 kg)   SpO2 99%   BMI 26.01 kg/m  Physical Exam Vitals and nursing note reviewed.  Constitutional:      General: She is not in acute distress.    Appearance: Normal appearance. She is not ill-appearing or toxic-appearing.  HENT:     Head: Normocephalic and atraumatic.     Right Ear: Tympanic membrane, ear canal and external ear normal. There is no impacted cerumen.     Left Ear: Ear canal and external ear normal. There is no impacted cerumen.     Ears:     Comments: Effusion present behind left TM , no erythema    Nose: No congestion or rhinorrhea.     Mouth/Throat:     Mouth: Mucous membranes are moist.     Pharynx: Oropharynx is clear. No oropharyngeal exudate or posterior oropharyngeal erythema.  Eyes:     General: No scleral icterus.       Right eye: No discharge.        Left eye: No discharge.     Extraocular Movements: Extraocular movements intact.     Conjunctiva/sclera: Conjunctivae normal.     Pupils: Pupils are equal, round, and reactive to light.  Cardiovascular:     Rate and Rhythm: Normal rate and regular rhythm.     Pulses: Normal pulses.     Heart sounds: Normal heart sounds. No murmur heard.    No friction rub. No gallop.  Pulmonary:     Effort: Pulmonary effort is normal. No respiratory distress.     Breath sounds: Normal breath sounds. No stridor. No wheezing, rhonchi or rales.  Chest:     Chest wall: No tenderness.  Abdominal:     General: Abdomen is flat. Bowel sounds are normal. There is no distension.     Palpations: Abdomen is soft. There is no mass.     Tenderness: There is no abdominal tenderness. There is  no right CVA tenderness, left CVA tenderness, guarding or rebound.     Hernia: No hernia is present.  Musculoskeletal:        General: No swelling, tenderness or deformity. Normal range of motion.     Cervical back: Normal range of motion and neck supple. No rigidity or tenderness.  Right lower leg: No edema.     Left lower leg: No edema.  Lymphadenopathy:     Cervical: No cervical adenopathy.  Skin:    General: Skin is warm and dry.     Coloration: Skin is not jaundiced or pale.     Findings: No bruising, erythema, lesion or rash.  Neurological:     General: No focal deficit present.     Mental Status: She is alert and oriented to person, place, and time. Mental status is at baseline.     Cranial Nerves: No cranial nerve deficit.     Sensory: No sensory deficit.     Motor: No weakness.     Coordination: Coordination normal.     Gait: Gait normal.     Deep Tendon Reflexes: Reflexes normal.  Psychiatric:        Mood and Affect: Mood normal.        Behavior: Behavior normal.        Thought Content: Thought content normal.        Judgment: Judgment normal.      No results found.  Assessment/plan: Norberta Cromartie is a 43 y.o. female present for CPE  Influenza vaccine needed - Flu vaccine trivalent PF, 6mos and older(Flulaval,Afluria,Fluarix,Fluzone) Breast cancer screening by mammogram - MM 3D SCREENING MAMMOGRAM BILATERAL BREAST; Future  Routine general medical examination at a health care facility Patient was encouraged to exercise greater than 150 minutes a week. Patient was encouraged to choose a diet filled with fresh fruits and vegetables, and lean meats. AVS provided to patient today for education/recommendation on gender specific health and safety maintenance. Colonoscopy: no fhx. Routine screen at 44 Mammogram: 05/23/2022- bcgso. PGM with breast cancer.  Cervical cancer screening: last pap: 2020 per pt. No prior abnormal. Uncertain if co-test preformed. She has a IUD  that will need removed next year.-Establish with gynecology Immunizations: tdap 04/2014 UTD, Influenza-updated today,  COVID vaccinated Infectious disease screening: HIV completed 2016, HEP c completed DEXA:routine screen. - CBC - Comprehensive metabolic panel - TSH  Return in about 1 year (around 05/05/2024) for cpe (20 min).  Orders Placed This Encounter  Procedures   MM 3D SCREENING MAMMOGRAM BILATERAL BREAST   Flu vaccine trivalent PF, 6mos and older(Flulaval,Afluria,Fluarix,Fluzone)   CBC   Comprehensive metabolic panel   TSH   No orders of the defined types were placed in this encounter.  Referral Orders  No referral(s) requested today      Electronically signed by: Felix Pacini, DO [facility] [place]

## 2023-05-06 ENCOUNTER — Encounter: Payer: Self-pay | Admitting: Family Medicine

## 2023-06-17 ENCOUNTER — Other Ambulatory Visit: Payer: Self-pay | Admitting: Family Medicine

## 2023-06-17 MED ORDER — PANTOPRAZOLE SODIUM 40 MG PO TBEC: 40.0000 mg | DELAYED_RELEASE_TABLET | Freq: Every day | ORAL | 1 refills | Status: DC

## 2023-06-17 NOTE — Telephone Encounter (Signed)
 Copied from CRM [id number]. Topic: Clinical - Medication Refill >> Jun 17, 2023 12:56 PM Alphonzo Lemmings O wrote: Most Recent Primary Care Visit:  Provider: Felix Pacini A  Department: [facility]  Visit Type: PHYSICAL  Date: 05/05/2023  Medication: pantoprazole (PROTONIX) 40 MG tablet   Has the patient contacted their pharmacy? Yes pharmacy told patient to contact Doctor for refill (Agent: If no, request that the patient contact the pharmacy for the refill. If patient does not wish to contact the pharmacy document the reason why and proceed with request.) (Agent: If yes, when and what did the pharmacy advise?)  Is this the correct pharmacy for this prescription? Yes If no, delete pharmacy and type the correct one.  This is the patient's preferred pharmacy:  [id number] - [place], [place] - [address] [place] [address] Phone: [phone] Fax: [phone]   Has the prescription been filled recently? No  Is the patient out of the medication? Yes  Has the patient been seen for an appointment in the last year OR does the patient have an upcoming appointment? Yes had physical in January   Can we respond through MyChart? Yes  Agent: Please be advised that Rx refills may take up to 3 business days. We ask that you follow-up with your pharmacy.

## 2023-09-14 ENCOUNTER — Other Ambulatory Visit: Payer: Self-pay | Admitting: Family Medicine

## 2023-09-14 ENCOUNTER — Ambulatory Visit (INDEPENDENT_AMBULATORY_CARE_PROVIDER_SITE_OTHER): Admitting: Family Medicine

## 2023-09-14 ENCOUNTER — Encounter: Payer: Self-pay | Admitting: Family Medicine

## 2023-09-14 VITALS — BP 112/74 | HR 72 | Temp 97.7°F | Wt 143.0 lb

## 2023-09-14 DIAGNOSIS — K219 Gastro-esophageal reflux disease without esophagitis: Secondary | ICD-10-CM | POA: Diagnosis not present

## 2023-09-14 MED ORDER — PANTOPRAZOLE SODIUM 40 MG PO TBEC: 40.0000 mg | DELAYED_RELEASE_TABLET | Freq: Every day | ORAL | 1 refills | Status: DC

## 2023-09-14 MED ORDER — FAMOTIDINE 20 MG PO TABS: 20.0000 mg | ORAL_TABLET | Freq: Two times a day (BID) | ORAL | 0 refills | Status: DC

## 2023-09-14 MED ORDER — SUCRALFATE 1 G PO TABS: 1.0000 g | ORAL_TABLET | Freq: Three times a day (TID) | ORAL | 0 refills | Status: DC

## 2023-09-14 NOTE — Patient Instructions (Addendum)
 Return if symptoms worsen or fail to improve.  Carafate 1-2 weeks before meals and before bed Continue protonix .  Pepcid  every 12 hours for 2-4 weeks.  Referred to gastroenterology      Great to see you today.  I have refilled the medication(s) we provide.   If labs were collected or images ordered, we will inform you of  results once we have received them and reviewed. We will contact you either by echart message, or telephone call.  Please give ample time to the testing facility, and our office to run,  receive and review results. Please do not call inquiring of results, even if you can see them in your chart. We will contact you as soon as we are able. If it has been over 1 week since the test was completed, and you have not yet heard from us , then please call us .    - echart message- for normal results that have been seen by the patient already.   - telephone call: abnormal results or if patient has not viewed results in their echart.  If a referral to a specialist was entered for you, please call us  in 2 weeks if you have not heard from the specialist office to schedule.

## 2023-09-14 NOTE — Progress Notes (Signed)
 Sonya Clayton , 1981-02-03, 43 y.o., female MRN: [id number] Patient Care Team    Relationship Specialty Notifications Start End  Mariel Shope, DO PCP - General Family Medicine  11/20/17   Wanita Gutta, MD Consulting Physician Obstetrics and Gynecology  01/03/20   Spainhour, Christiane Cowing, PA  Otolaryngology  05/05/23     Chief Complaint  Patient presents with   Heartburn    Over the last week; pt takes pantoprazole  but found no relief.      Subjective: Sonya Clayton is a 43 y.o. Pt presents for an OV with complaints of "heartburn" of 1 week duration.  Associated symptoms include patient reports she had chest discomfort that radiated towards her back of significant heartburn on Thursday.  She reports she had eaten a small amount of curry that day, but not much and it was not hot.  She has changed her diet a great deal consuming bland/nonspicy foods. Has history of GERD and is prescribed Protonix  40 mg daily as she has been compliant with his medication. EGD 2018 and 2014.  Last completed in [place] , no records available. She has also started a vitamin D /omega-3 supplement that she had also taken Thursday.  Since that time she stopped taking the supplement. She reports Friday the discomfort was still pretty bad, but since then symptoms have been improving slowly     05/05/2023   10:45 AM 03/21/2022    1:47 PM 05/01/2021   10:35 AM 03/12/2021    1:33 PM 12/10/2020    1:38 PM  Depression screen PHQ 2/9  Decreased Interest 0 0 0 0 0  Down, Depressed, Hopeless 0 0 0 0 0  PHQ - 2 Score 0 0 0 0 0    Allergies  Allergen Reactions   Penicillins Hives   Social History   Social History Narrative   Marital status/children/pets: married, 2 children   Education/employment: B.S. Electrical eng.    Safety:      -Wears a bicycle helmet riding a bike: No     -smoke alarm in the home:Yes     - wears seatbelt: Yes     - Feels safe in their relationships: Yes   Past Medical  History:  Diagnosis Date   Chicken pox    GERD (gastroesophageal reflux disease)    Gestational diabetes    Infertility, female    Thyroid  disease    Vegetarian diet    Past Surgical History:  Procedure Laterality Date   NO PAST SURGERIES     WISDOM TOOTH EXTRACTION     Family History  Problem Relation Age of Onset   Hypertension Mother    Hypertension Father    Breast cancer Paternal Grandmother    Diabetes Paternal Grandmother    Hypertension Paternal Grandmother    Hypertension Paternal Grandfather    Allergies as of 09/14/2023       Reactions   Penicillins Hives        Medication List        Accurate as of September 14, 2023  3:00 PM. If you have any questions, ask your nurse or doctor.          famotidine  20 MG tablet Commonly known as: PEPCID  Take 1 tablet (20 mg total) by mouth 2 (two) times daily. Started by: Ahamed Hofland   fluticasone  50 MCG/ACT nasal spray Commonly known as: FLONASE  SPRAY 2 SPRAYS INTO EACH NOSTRIL EVERY DAY   levocetirizine 5 MG tablet Commonly known as:  XYZAL  Take 1 tablet (5 mg total) by mouth every evening.   multivitamin capsule Take 1 capsule by mouth daily.   pantoprazole  40 MG tablet Commonly known as: PROTONIX  Take 1 tablet (40 mg total) by mouth daily.   sucralfate 1 g tablet Commonly known as: Carafate Take 1 tablet (1 g total) by mouth 4 (four) times daily -  with meals and at bedtime. Started by: Severiano Utsey   Vitamin D3 50 MCG (2000 UT) Tabs Take 1 tablet by mouth daily.        All past medical history, surgical history, allergies, family history, immunizations andmedications were updated in the EMR today and reviewed under the history and medication portions of their EMR.     ROS Negative, with the exception of above mentioned in HPI   Objective:  BP 112/74   Pulse 72   Temp 97.7 F (36.5 C)   Wt 143 lb (64.9 kg)   SpO2 97%   BMI 26.16 kg/m  Body mass index is 26.16 kg/m. Physical Exam Vitals  and nursing note reviewed.  Constitutional:      General: She is not in acute distress.    Appearance: Normal appearance. She is normal weight. She is not ill-appearing or toxic-appearing.  HENT:     Head: Normocephalic and atraumatic.  Eyes:     General: No scleral icterus.       Right eye: No discharge.        Left eye: No discharge.     Extraocular Movements: Extraocular movements intact.     Conjunctiva/sclera: Conjunctivae normal.     Pupils: Pupils are equal, round, and reactive to light.  Abdominal:     General: Abdomen is flat. Bowel sounds are normal. There is no distension.     Palpations: Abdomen is soft. There is no mass.     Tenderness: There is no abdominal tenderness. There is no guarding or rebound.     Hernia: No hernia is present.  Skin:    Findings: No rash.  Neurological:     Mental Status: She is alert and oriented to person, place, and time. Mental status is at baseline.     Motor: No weakness.     Coordination: Coordination normal.     Gait: Gait normal.  Psychiatric:        Mood and Affect: Mood normal.        Behavior: Behavior normal.        Thought Content: Thought content normal.        Judgment: Judgment normal.      No results found. No results found. No results found for this or any previous visit (from the past 24 hours).  Assessment/Plan: [name] is a 43 y.o. female present for OV for  Gastroesophageal reflux disease without esophagitis (Primary) Continue monitor food triggers and attempt to avoid Continue Protonix  daily Added Pepcid  20 mg twice daily next 2-4 weeks Added Carafate premeals and nightly next 1 to 2 weeks - Ambulatory referral to Gastroenterology placed today  Reviewed expectations re: course of current medical issues. Discussed self-management of symptoms. Outlined signs and symptoms indicating need for more acute intervention. Patient verbalized understanding and all questions were answered. Patient received  an After-Visit Summary.    Orders Placed This Encounter  Procedures   Ambulatory referral to Gastroenterology   Meds ordered this encounter  Medications   pantoprazole  (PROTONIX ) 40 MG tablet    Sig: Take 1 tablet (40 mg total) by mouth daily.  Dispense:  90 tablet    Refill:  1   sucralfate (CARAFATE) 1 g tablet    Sig: Take 1 tablet (1 g total) by mouth 4 (four) times daily -  with meals and at bedtime.    Dispense:  60 tablet    Refill:  0   famotidine  (PEPCID ) 20 MG tablet    Sig: Take 1 tablet (20 mg total) by mouth 2 (two) times daily.    Dispense:  60 tablet    Refill:  0   Referral Orders         Ambulatory referral to Gastroenterology       Note is dictated utilizing voice recognition software. Although note has been proof read prior to signing, occasional typographical errors still can be missed. If any questions arise, please do not hesitate to call for verification.   electronically signed by:  Napolean Backbone, DO  [facility] [place]

## 2023-09-15 ENCOUNTER — Other Ambulatory Visit: Payer: Self-pay | Admitting: Family Medicine

## 2023-09-15 NOTE — Telephone Encounter (Signed)
 Please inform pt of the necessary change from pepcid  to tagament d/t insurance.

## 2023-09-24 ENCOUNTER — Encounter: Payer: Self-pay | Admitting: Gastroenterology

## 2023-10-02 ENCOUNTER — Ambulatory Visit (INDEPENDENT_AMBULATORY_CARE_PROVIDER_SITE_OTHER): Admitting: Family Medicine

## 2023-10-02 ENCOUNTER — Encounter: Payer: Self-pay | Admitting: Family Medicine

## 2023-10-02 VITALS — BP 114/74 | HR 83 | Temp 97.9°F | Wt 144.0 lb

## 2023-10-02 DIAGNOSIS — B9689 Other specified bacterial agents as the cause of diseases classified elsewhere: Secondary | ICD-10-CM

## 2023-10-02 DIAGNOSIS — N3000 Acute cystitis without hematuria: Secondary | ICD-10-CM | POA: Diagnosis not present

## 2023-10-02 DIAGNOSIS — J329 Chronic sinusitis, unspecified: Secondary | ICD-10-CM

## 2023-10-02 MED ORDER — CEFDINIR 300 MG PO CAPS: 300.0000 mg | ORAL_CAPSULE | Freq: Two times a day (BID) | ORAL | 0 refills | Status: DC

## 2023-10-02 NOTE — Progress Notes (Signed)
 Sonya Clayton , 1980-09-03, 43 y.o., female MRN: [id number] Patient Care Team    Relationship Specialty Notifications Start End  Mariel Shope, DO PCP - General Family Medicine  11/20/17   Wanita Gutta, MD Consulting Physician Obstetrics and Gynecology  01/03/20   Spainhour, Christiane Cowing, PA  Otolaryngology  05/05/23     Chief Complaint  Patient presents with   Nasal Congestion    1 week; head/nasal congestion, low-grade fever. Pt has taken tylenol. Pt tested for Covid & Flu yesterday morning, both were negative.      Subjective: Sonya Clayton is a 43 y.o. Pt presents for an OV with complaints of nasal congestion with sputum production of > 1 week  duration.  Associated symptoms include low grade temp 99.79F. Had UTI last Friday>UC> took abx. Macrobid> felt n=better by Monday but has very mild lingering symptoms Friday evening, sore throat, ear pain, hoarseness, congestion, sinus pain.  Took tylenol/advil Took covid test yesterday and negative     05/05/2023   10:45 AM 03/21/2022    1:47 PM 05/01/2021   10:35 AM 03/12/2021    1:33 PM 12/10/2020    1:38 PM  Depression screen PHQ 2/9  Decreased Interest 0 0 0 0 0  Down, Depressed, Hopeless 0 0 0 0 0  PHQ - 2 Score 0 0 0 0 0    Allergies  Allergen Reactions   Penicillins Hives   Social History   Social History Narrative   Marital status/children/pets: married, 2 children   Education/employment: B.S. Electrical eng.    Safety:      -Wears a bicycle helmet riding a bike: No     -smoke alarm in the home:Yes     - wears seatbelt: Yes     - Feels safe in their relationships: Yes   Past Medical History:  Diagnosis Date   Chicken pox    GERD (gastroesophageal reflux disease)    Gestational diabetes    Infertility, female    Thyroid  disease    Vegetarian diet    Past Surgical History:  Procedure Laterality Date   NO PAST SURGERIES     WISDOM TOOTH EXTRACTION     Family History  Problem Relation Age of  Onset   Hypertension Mother    Hypertension Father    Breast cancer Paternal Grandmother    Diabetes Paternal Grandmother    Hypertension Paternal Grandmother    Hypertension Paternal Grandfather    Allergies as of 10/02/2023       Reactions   Penicillins Hives        Medication List        Accurate as of October 02, 2023  9:16 AM. If you have any questions, ask your nurse or doctor.          cefdinir  300 MG capsule Commonly known as: OMNICEF  Take 1 capsule (300 mg total) by mouth 2 (two) times daily. Started by: Napolean Backbone   cimetidine 400 MG tablet Commonly known as: TAGAMET Take 1 tablet (400 mg total) by mouth 2 (two) times daily.   fluticasone  50 MCG/ACT nasal spray Commonly known as: FLONASE  SPRAY 2 SPRAYS INTO EACH NOSTRIL EVERY DAY   levocetirizine 5 MG tablet Commonly known as: XYZAL  Take 1 tablet (5 mg total) by mouth every evening.   multivitamin capsule Take 1 capsule by mouth daily.   pantoprazole  40 MG tablet Commonly known as: PROTONIX  Take 1 tablet (40 mg total) by mouth daily.  sucralfate  1 g tablet Commonly known as: Carafate  Take 1 tablet (1 g total) by mouth 4 (four) times daily -  with meals and at bedtime.   Vitamin D3 50 MCG (2000 UT) Tabs Take 1 tablet by mouth daily.        All past medical history, surgical history, allergies, family history, immunizations andmedications were updated in the EMR today and reviewed under the history and medication portions of their EMR.     ROS Negative, with the exception of above mentioned in HPI   Objective:  BP 114/74   Pulse 83   Temp 97.9 F (36.6 C)   Wt 144 lb (65.3 kg)   SpO2 96%   BMI 26.34 kg/m  Body mass index is 26.34 kg/m. Physical Exam Vitals and nursing note reviewed.  Constitutional:      General: She is not in acute distress.    Appearance: Normal appearance. She is normal weight. She is not ill-appearing or toxic-appearing.  HENT:     Head: Normocephalic and  atraumatic.     Right Ear: Ear canal and external ear normal.     Left Ear: Ear canal and external ear normal.     Nose: Congestion and rhinorrhea present.     Mouth/Throat:     Mouth: Mucous membranes are moist.     Pharynx: No oropharyngeal exudate or posterior oropharyngeal erythema.     Comments: PND  Eyes:     General: No scleral icterus.       Right eye: No discharge.        Left eye: No discharge.     Extraocular Movements: Extraocular movements intact.     Conjunctiva/sclera: Conjunctivae normal.     Pupils: Pupils are equal, round, and reactive to light.    Cardiovascular:     Rate and Rhythm: Normal rate and regular rhythm.  Pulmonary:     Effort: Pulmonary effort is normal. No respiratory distress.     Breath sounds: Normal breath sounds. No wheezing, rhonchi or rales.     Comments: Cough present  Musculoskeletal:     Cervical back: Neck supple. No tenderness.     Right lower leg: No edema.     Left lower leg: No edema.  Lymphadenopathy:     Cervical: Cervical adenopathy present.   Skin:    Findings: No rash.   Neurological:     Mental Status: She is alert and oriented to person, place, and time. Mental status is at baseline.     Motor: No weakness.     Coordination: Coordination normal.     Gait: Gait normal.   Psychiatric:        Mood and Affect: Mood normal.        Behavior: Behavior normal.        Thought Content: Thought content normal.        Judgment: Judgment normal.      No results found. No results found. No results found for this or any previous visit (from the past 24 hours).  Assessment/Plan: [name] is a 43 y.o. female present for OV for  Bacterial sinusitis (Primary) Rest, hydrate.  flonase , mucinex (DM if cough), nettie pot or nasal saline.  Continue antihistamine omnicef  prescribed, take until completed.  If cough present it can last up to 6-8 weeks.  F/U 2 weeks if not improved.    Acute cystitis without  hematuria Elected to treat sinus infection with omnicef  to cover any possible lingering UTI if present  Reviewed expectations re: course of current medical issues. Discussed self-management of symptoms. Outlined signs and symptoms indicating need for more acute intervention. Patient verbalized understanding and all questions were answered. Patient received an After-Visit Summary.    No orders of the defined types were placed in this encounter.  Meds ordered this encounter  Medications   cefdinir  (OMNICEF ) 300 MG capsule    Sig: Take 1 capsule (300 mg total) by mouth 2 (two) times daily.    Dispense:  20 capsule    Refill:  0   Referral Orders  No referral(s) requested today     Note is dictated utilizing voice recognition software. Although note has been proof read prior to signing, occasional typographical errors still can be missed. If any questions arise, please do not hesitate to call for verification.   electronically signed by:  Napolean Backbone, DO  [facility] [place]

## 2023-10-02 NOTE — Patient Instructions (Addendum)

## 2023-10-06 ENCOUNTER — Other Ambulatory Visit: Payer: Self-pay | Admitting: Family Medicine

## 2023-11-06 ENCOUNTER — Ambulatory Visit: Admitting: Gastroenterology

## 2023-11-06 ENCOUNTER — Encounter: Payer: Self-pay | Admitting: Gastroenterology

## 2023-11-06 VITALS — BP 118/68 | HR 70 | Ht 62.0 in | Wt 141.0 lb

## 2023-11-06 DIAGNOSIS — R12 Heartburn: Secondary | ICD-10-CM

## 2023-11-06 DIAGNOSIS — R09A2 Foreign body sensation, throat: Secondary | ICD-10-CM | POA: Diagnosis not present

## 2023-11-06 DIAGNOSIS — K219 Gastro-esophageal reflux disease without esophagitis: Secondary | ICD-10-CM | POA: Diagnosis not present

## 2023-11-06 DIAGNOSIS — R0989 Other specified symptoms and signs involving the circulatory and respiratory systems: Secondary | ICD-10-CM

## 2023-11-06 NOTE — Progress Notes (Signed)
 Sonya Clayton [id number] 10-15-1980   Chief Complaint: GERD  Referring Provider: Catherine Charlies LABOR, DO Primary GI MD: Sampson (requesting Dr. Shila)  HPI: Sonya Clayton is a 43 y.o. female with past medical history of GERD, LPR, gestational diabetes, recurrent sinusitis, thyroid  disease, vegetarian diet who presents today for a complaint of GERD.    09/14/2023 patient seen by PCP with complaint of 1 week of heartburn.  Had associated symptoms of chest discomfort with radiation toward her back.  History of GERD and previously prescribed Protonix  40 mg daily, has been compliant with medication.  Had EGD in 2018 and 2014, no records available.  Advised to continue Protonix  daily and added Pepcid  20 mg twice daily, as well as Carafate , and referred to GI for further evaluation.  Labs 05/05/2023: Normal CBC, CMP, TSH   Patient states she was diagnosed with GERD around 2015, while living in [place] .  Had been having problems with sinusitis, was seen by ENT and told she likely had acid reflux.  She had an EGD around that time and diagnosed with GERD.  Since then has been taking Protonix  40 mg daily.  She had another EGD around 2018/2019.  She denies any finding of ulcers or esophagitis.  Does not recall the name of the GI practice she went to but will try to find out so we can obtain records.  States that her reflux has been fairly well-controlled between use of Protonix  and lifestyle modifications, though she does occasionally have breakthrough reflux with certain foods.  A couple months ago she was having epigastric pain which was unusual for her.  Took Protonix , Pepcid , and Carafate  and symptoms resolved.  She is now back to taking Protonix  alone and has not had recurrence of heartburn.  She does endorse frequent throat clearing and sensation of something being in her throat.  Denies any dysphagia.  She has regular bowel movements and denies diarrhea, constipation, blood in her stool or  black stools.  She has some occasional abdominal bloating around the time of her period.  She has concerns about the long-term use of PPIs.  States she has vitamin B12 and vitamin D  deficiencies.  She is interested in possibly having antireflux surgery.  She denies any chest pain or shortness of breath.  Denies any heart or lung problems.  Is not on blood thinners.  Denies any family history of GI cancers.  Previous GI Procedures/Imaging      Past Medical History:  Diagnosis Date   Chicken pox    GERD (gastroesophageal reflux disease)    Gestational diabetes    Infertility, female    Laryngopharyngeal reflux    Recurrent sinusitis    Thyroid  disease    Vegetarian diet     Past Surgical History:  Procedure Laterality Date   WISDOM TOOTH EXTRACTION      Current Outpatient Medications  Medication Sig Dispense Refill   cefdinir  (OMNICEF ) 300 MG capsule Take 1 capsule (300 mg total) by mouth 2 (two) times daily. 20 capsule 0   Cholecalciferol (VITAMIN D3) 50 MCG (2000 UT) TABS Take 1 tablet by mouth daily.     cimetidine (TAGAMET) 400 MG tablet Take 1 tablet (400 mg total) by mouth 2 (two) times daily. 180 tablet 1   fluticasone  (FLONASE ) 50 MCG/ACT nasal spray SPRAY 2 SPRAYS INTO EACH NOSTRIL EVERY DAY 16 g 2   levocetirizine (XYZAL ) 5 MG tablet Take 1 tablet (5 mg total) by mouth every evening. 30 tablet 11   Multiple  Vitamin (MULTIVITAMIN) capsule Take 1 capsule by mouth daily.     pantoprazole  (PROTONIX ) 40 MG tablet Take 1 tablet (40 mg total) by mouth daily. 90 tablet 1   sucralfate  (CARAFATE ) 1 g tablet Take 1 tablet (1 g total) by mouth 4 (four) times daily -  with meals and at bedtime. 60 tablet 0   No current facility-administered medications for this visit.    Allergies as of 11/06/2023 - Review Complete 10/02/2023  Allergen Reaction Noted   Penicillins Hives 11/20/2017    Family History  Problem Relation Age of Onset   Hypertension Mother    Hypertension  Father    Breast cancer Paternal Grandmother    Diabetes Paternal Grandmother    Hypertension Paternal Grandmother    Hypertension Paternal Grandfather     Social History   Tobacco Use   Smoking status: Never   Smokeless tobacco: Never  Vaping Use   Vaping status: Never Used  Substance Use Topics   Alcohol use: Not Currently    Comment: occ   Drug use: Never     Review of Systems:    Constitutional: No unintentional weight loss, fever, chills, weakness or fatigue Skin: No rash or itching Cardiovascular: No chest pain, chest pressure or palpitations   Respiratory: No SOB or cough Gastrointestinal: See HPI and otherwise negative Genitourinary: No dysuria or change in urinary frequency Neurological: No headache, dizziness or syncope Musculoskeletal: No new muscle or joint pain Hematologic: No bleeding or bruising    Physical Exam:  Vital signs: BP 118/68   Pulse 70   Ht 5' 2 (1.575 m)   Wt 141 lb (64 kg)   BMI 25.79 kg/m    Constitutional: Pleasant female in NAD, alert and cooperative Head:  Normocephalic and atraumatic.  Eyes: No scleral icterus. Respiratory: Respirations even and unlabored. Lungs clear to auscultation bilaterally.  No wheezes, crackles, or rhonchi.  Cardiovascular:  Regular rate and rhythm. No murmurs. No peripheral edema. Gastrointestinal:  Soft, nondistended, nontender. No rebound or guarding. Normal bowel sounds. No appreciable masses or hepatomegaly. Rectal:  Not performed.  Neurologic:  Alert and oriented x4;  grossly normal neurologically.  Skin:   Dry and intact without significant lesions or rashes. Psychiatric: Oriented to person, place and time. Demonstrates good judgement and reason without abnormal affect or behaviors.   RELEVANT LABS AND IMAGING: CBC    Component Value Date/Time   WBC 9.5 05/05/2023 1046   RBC 4.73 05/05/2023 1046   HGB 14.1 05/05/2023 1046   HGB 13.8 05/14/2022 0946   HCT 41.3 05/05/2023 1046   HCT 40.6  05/14/2022 0946   PLT 274.0 05/05/2023 1046   PLT 234 05/14/2022 0946   MCV 87.1 05/05/2023 1046   MCV 86 05/14/2022 0946   MCH 29.3 05/14/2022 0946   MCH 28.6 11/20/2017 1509   MCHC 34.2 05/05/2023 1046   RDW 13.0 05/05/2023 1046   RDW 12.2 05/14/2022 0946   LYMPHSABS 2.0 05/14/2022 0946   MONOABS 0.5 05/01/2021 1203   EOSABS 0.2 05/14/2022 0946   BASOSABS 0.1 05/14/2022 0946    CMP     Component Value Date/Time   NA 137 05/05/2023 1046   K 4.5 05/05/2023 1046   CL 105 05/05/2023 1046   CO2 26 05/05/2023 1046   GLUCOSE 70 05/05/2023 1046   BUN 8 05/05/2023 1046   CREATININE 0.61 05/05/2023 1046   CALCIUM 9.8 05/05/2023 1046   PROT 7.7 05/05/2023 1046   ALBUMIN 4.8 05/05/2023 1046   AST  20 05/05/2023 1046   ALT 17 05/05/2023 1046   ALKPHOS 52 05/05/2023 1046   BILITOT 0.4 05/05/2023 1046     Assessment/Plan:   GERD Heartburn Globus sensation Throat clearing Patient seen today in consult for recent worsening of GERD symptoms.  A couple months ago had been having heartburn, which was unusual for her.  She has a history of GERD going back to 2015 and has had 2 upper endoscopies done in [place] .  She has been taking Protonix  40 mg daily for about 10 years now.  Her PCP prescribed Pepcid  and Carafate  in addition to her Protonix , and after taking these medications her heartburn resolved.  She does continue to have occasional breakthrough acid reflux as well as globus sensation and throat clearing.  She denies any dysphagia.  Denies melena.  Denies any history of ulcers.  She is interested in pursuing antireflux surgery in the future.  She has concerns about long-term use of PPI and would like to limit use of this if possible.  - Will schedule EGD. I thoroughly discussed the procedure with the patient to include nature of the procedure, alternatives, benefits, and risks (including but not limited to bleeding, infection, perforation, anesthesia/cardiac/pulmonary complications).  Patient verbalized understanding and gave verbal consent to proceed with procedure.  - Continue Protonix  40 mg daily - May use Pepcid  20 mg daily as needed - Continue lifestyle modifications for GERD. - Follow-up after procedure to discuss possibility of antireflux surgery   Camie Furbish, PA-C [facility] 11/06/2023, 1:21 PM  Patient Care Team: Catherine Charlies LABOR, DO as PCP - General (Family Medicine) Jannis Kate Norris, MD as Consulting Physician (Obstetrics and Gynecology) Spainhour, Alm RAMAN, PA (Otolaryngology)

## 2023-11-06 NOTE — Patient Instructions (Signed)
 You have been scheduled for an endoscopy. Please follow written instructions given to you at your visit today.  If you use inhalers (even only as needed), please bring them with you on the day of your procedure.  If you take any of the following medications, they will need to be adjusted prior to your procedure:   DO NOT TAKE 7 DAYS PRIOR TO TEST- Trulicity (dulaglutide) Ozempic, Wegovy (semaglutide) Mounjaro (tirzepatide) Bydureon Bcise (exanatide extended release)  DO NOT TAKE 1 DAY PRIOR TO YOUR TEST Rybelsus (semaglutide) Adlyxin (lixisenatide) Victoza (liraglutide) Byetta (exanatide) ___________________________________________________________________________  Continue your protonix  40mg  once daily.   Please let us  know the name of your last GI Doctor (or practice name) in [place]  so we can request your records.  We have attached lifestyle modifications for GERD.   _______________________________________________________  If your blood pressure at your visit was 140/90 or greater, please contact your primary care physician to follow up on this.  _______________________________________________________  If you are age 44 or older, your body mass index should be between 23-30. Your Body mass index is 25.79 kg/m. If this is out of the aforementioned range listed, please consider follow up with your Primary Care Provider.  If you are age 44 or younger, your body mass index should be between 19-25. Your Body mass index is 25.79 kg/m. If this is out of the aformentioned range listed, please consider follow up with your Primary Care Provider.   ________________________________________________________  The [facility] providers would like to encourage you to use MYCHART to communicate with providers for non-urgent requests or questions.  Due to long hold times on the telephone, sending your provider a message by [facility] may be a faster and more efficient way to get a response.  Please  allow 48 business hours for a response.  Please remember that this is for non-urgent requests.  _______________________________________________________  [facility] is using a team-based approach to care.  Your team is made up of your doctor and two to three APPS. Our APPS (Nurse Practitioners and Physician Assistants) work with your physician to ensure care continuity for you. They are fully qualified to address your health concerns and develop a treatment plan. They communicate directly with your gastroenterologist to care for you. Seeing the Advanced Practice Practitioners on your physician's team can help you by facilitating care more promptly, often allowing for earlier appointments, access to diagnostic testing, procedures, and other specialty referrals.    .I appreciate the opportunity to care for you.

## 2023-11-10 ENCOUNTER — Telehealth: Payer: Self-pay

## 2023-11-10 NOTE — Telephone Encounter (Signed)
 I have SARINA Heck for some ROI to get her records and to let her know to set up an appointment with Dr Nandigam for after her EGD to be done in late August.

## 2023-12-03 ENCOUNTER — Encounter: Payer: Self-pay | Admitting: Gastroenterology

## 2023-12-11 ENCOUNTER — Ambulatory Visit: Admitting: Gastroenterology

## 2023-12-11 ENCOUNTER — Encounter: Payer: Self-pay | Admitting: Gastroenterology

## 2023-12-11 VITALS — BP 127/75 | HR 60 | Temp 98.3°F | Resp 15 | Ht 62.0 in | Wt 141.0 lb

## 2023-12-11 DIAGNOSIS — K449 Diaphragmatic hernia without obstruction or gangrene: Secondary | ICD-10-CM | POA: Diagnosis not present

## 2023-12-11 DIAGNOSIS — K219 Gastro-esophageal reflux disease without esophagitis: Secondary | ICD-10-CM

## 2023-12-11 MED ORDER — RABEPRAZOLE SODIUM 20 MG PO TBEC: 20.0000 mg | DELAYED_RELEASE_TABLET | Freq: Two times a day (BID) | ORAL | 3 refills | Status: AC

## 2023-12-11 MED ORDER — SODIUM CHLORIDE 0.9 % IV SOLN: 500.0000 mL | Freq: Once | INTRAVENOUS | Status: DC

## 2023-12-11 NOTE — Op Note (Signed)
 [facility] Patient Name: Sonya Clayton Procedure Date: 12/11/2023 9:48 AM MRN: [id number] Endoscopist: Gustav ALONSO Mcgee , MD, [id number] Age: 43 Referring MD:  Date of Birth: [birth year] Gender: Female Account #: [id number] Procedure:                Upper GI endoscopy Indications:              Esophageal reflux symptoms that persist despite                            appropriate therapy Medicines:                Monitored Anesthesia Care Procedure:                Pre-Anesthesia Assessment:                           - Prior to the procedure, a History and Physical                            was performed, and patient medications and                            allergies were reviewed. The patient's tolerance of                            previous anesthesia was also reviewed. The risks                            and benefits of the procedure and the sedation                            options and risks were discussed with the patient.                            All questions were answered, and informed consent                            was obtained. Prior Anticoagulants: The patient has                            taken no anticoagulant or antiplatelet agents. ASA                            Grade Assessment: II - A patient with mild systemic                            disease. After reviewing the risks and benefits,                            the patient was deemed in satisfactory condition to                            undergo the procedure.  After obtaining informed consent, the endoscope was                            passed under direct vision. Throughout the                            procedure, the patient's blood pressure, pulse, and                            oxygen saturations were monitored continuously. The                            GIF [id number] #[id number] was introduced through the                            mouth, and advanced to the  second part of duodenum.                            The upper GI endoscopy was accomplished without                            difficulty. The patient tolerated the procedure                            well. Scope In: Scope Out: Findings:                 The Z-line was regular and was found 34 cm from the                            incisors.                           The gastroesophageal flap valve was visualized                            endoscopically and classified as Hill Grade III                            (minimal fold, loose to endoscope, hiatal hernia                            likely).                           A 2 cm hiatal hernia was present.                           Normal mucosa was found in the entire esophagus.                            Biopsies were obtained from the proximal and distal                            esophagus with cold forceps for histology of  suspected eosinophilic esophagitis.                           The stomach was normal.                           The cardia and gastric fundus were normal on                            retroflexion.                           The examined duodenum was normal. Complications:            No immediate complications. Estimated Blood Loss:     Estimated blood loss was minimal. Impression:               - Z-line regular, 34 cm from the incisors.                           - Gastroesophageal flap valve classified as Hill                            Grade III (minimal fold, loose to endoscope, hiatal                            hernia likely).                           - 2 cm hiatal hernia.                           - Normal mucosa was found in the entire esophagus.                           - Normal stomach.                           - Normal examined duodenum.                           - Biopsies were taken with a cold forceps for                            evaluation of eosinophilic  esophagitis. Recommendation:           - Resume previous diet.                           - Continue present medications.                           - Await pathology results.                           - Follow an antireflux regimen.                           - Aciphex  20mg   twice daily X 90 days with 3 refills                           - DC pantoprazole , carafate  and h2 blocker                           - Schedule esophageal manometyr and 24 ph impedance                            study off PPI X 7 days                           - Office follow up visit with Dr San to                            discuss possible TIF Gustav ALONSO Mcgee, MD 12/11/2023 10:40:13 AM This report has been signed electronically.

## 2023-12-11 NOTE — Progress Notes (Signed)
 [facility] History and Physical   Primary Care Physician:  Catherine Charlies LABOR, DO   Reason for Procedure:   Plan:    EGD and colonoscopy with possible interventions as needed     HPI: Sonya Clayton is a very pleasant 43 y.o. female here for EGD for evaluation of persistent heartburn, GERD despite PPI.   The risks and benefits as well as alternatives of endoscopic procedure(s) have been discussed and reviewed. All questions answered. The patient agrees to proceed.    Past Medical History:  Diagnosis Date   Chicken pox    GERD (gastroesophageal reflux disease)    Gestational diabetes    Infertility, female    Laryngopharyngeal reflux    Recurrent sinusitis    Thyroid  disease    Vegetarian diet     Past Surgical History:  Procedure Laterality Date   WISDOM TOOTH EXTRACTION      Prior to Admission medications   Medication Sig Start Date End Date Taking? Authorizing Provider  pantoprazole  (PROTONIX ) 40 MG tablet Take 1 tablet (40 mg total) by mouth daily. 09/14/23  Yes Kuneff, Renee A, DO  Cholecalciferol (VITAMIN D3) 50 MCG (2000 UT) TABS Take 1 tablet by mouth daily.    [provider]  cimetidine (TAGAMET) 400 MG tablet Take 1 tablet (400 mg total) by mouth 2 (two) times daily. 09/15/23   Kuneff, Renee A, DO  fluticasone  (FLONASE ) 50 MCG/ACT nasal spray SPRAY 2 SPRAYS INTO EACH NOSTRIL EVERY DAY 03/21/22   Kuneff, Renee A, DO  levocetirizine (XYZAL ) 5 MG tablet Take 1 tablet (5 mg total) by mouth every evening. 03/31/23   Kuneff, Renee A, DO  Multiple Vitamin (MULTIVITAMIN) capsule Take 1 capsule by mouth daily.    [provider]  sucralfate  (CARAFATE ) 1 g tablet Take 1 tablet (1 g total) by mouth 4 (four) times daily -  with meals and at bedtime. 09/14/23   Catherine Charlies A, DO    Current Outpatient Medications  Medication Sig Dispense Refill   pantoprazole  (PROTONIX ) 40 MG tablet Take 1 tablet (40 mg total) by mouth daily. 90 tablet 1    Cholecalciferol (VITAMIN D3) 50 MCG (2000 UT) TABS Take 1 tablet by mouth daily.     cimetidine (TAGAMET) 400 MG tablet Take 1 tablet (400 mg total) by mouth 2 (two) times daily. 180 tablet 1   fluticasone  (FLONASE ) 50 MCG/ACT nasal spray SPRAY 2 SPRAYS INTO EACH NOSTRIL EVERY DAY 16 g 2   levocetirizine (XYZAL ) 5 MG tablet Take 1 tablet (5 mg total) by mouth every evening. 30 tablet 11   Multiple Vitamin (MULTIVITAMIN) capsule Take 1 capsule by mouth daily.     sucralfate  (CARAFATE ) 1 g tablet Take 1 tablet (1 g total) by mouth 4 (four) times daily -  with meals and at bedtime. 60 tablet 0   Current Facility-Administered Medications  Medication Dose Route Frequency Provider Last Rate Last Admin   0.9 %  sodium chloride  infusion  500 mL Intravenous Once [name], MD        Allergies as of 12/11/2023 - Review Complete 12/11/2023  Allergen Reaction Noted   Penicillins Hives 11/20/2017    Family History  Problem Relation Age of Onset   Hypertension Mother    Hypertension Father    Breast cancer Paternal Grandmother    Diabetes Paternal Grandmother    Hypertension Paternal Grandmother    Hypertension Paternal Grandfather    Liver disease Neg Hx    Esophageal cancer Neg  Hx    Colon cancer Neg Hx     Social History   Socioeconomic History   Marital status: Married    Spouse name: Not on file   Number of children: 2   Years of education: Not on file   Highest education level: Not on file  Occupational History   Not on file  Tobacco Use   Smoking status: Never   Smokeless tobacco: Never  Vaping Use   Vaping status: Never Used  Substance and Sexual Activity   Alcohol use: Not Currently    Comment: occ   Drug use: Never   Sexual activity: Yes    Partners: Male    Birth control/protection: None  Other Topics Concern   Not on file  Social History Narrative   Marital status/children/pets: married, 2 children   Education/employment: B.S. Electrical eng.     Safety:      -Wears a bicycle helmet riding a bike: No     -smoke alarm in the home:Yes     - wears seatbelt: Yes     - Feels safe in their relationships: Yes   Social Drivers of Corporate investment banker Strain: Not on file  Food Insecurity: Not on file  Transportation Needs: Not on file  Physical Activity: Not on file  Stress: Not on file  Social Connections: Not on file  Intimate Partner Violence: Not on file    Review of Systems:  All other review of systems negative except as mentioned in the HPI.  Physical Exam: Vital signs in last 24 hours: BP 117/75   Pulse 64   Temp 98.3 F (36.8 C) (Temporal)   Ht 5' 2 (1.575 m)   Wt 141 lb (64 kg)   LMP 11/14/2023   SpO2 100%   BMI 25.79 kg/m  General:   Alert, NAD Lungs:  Clear .   Heart:  Regular rate and rhythm Abdomen:  Soft, nontender and nondistended. Neuro/Psych:  Alert and cooperative. Normal mood and affect. A and O x 3  Reviewed labs, radiology imaging, old records and pertinent past GI work up  Patient is appropriate for planned procedure(s) and anesthesia in an ambulatory setting   K. Veena Jemya Depierro , MD [phone]

## 2023-12-11 NOTE — Progress Notes (Signed)
1000 Robinul 0.1 mg IV given due large amount of secretions upon assessment.  MD made aware, vss 

## 2023-12-11 NOTE — Progress Notes (Signed)
 Report given to PACU, vss

## 2023-12-11 NOTE — Progress Notes (Signed)
 Pt's states no medical or surgical changes since previsit or office visit.

## 2023-12-11 NOTE — Patient Instructions (Addendum)
 Resume previous diet.                           - Continue present medications.                           - Await pathology results.                           - Follow an antireflux regimen.                           - Aciphex  20mg  twice daily X 90 days with 3 refills                           - DC pantoprazole , carafate  and h2 blocker                           - Schedule esophageal manometyr and 24 ph impedance                            study off PPI X 7 days                           - Office follow up visit with Dr San to                            discuss possible TIF     YOU HAD AN ENDOSCOPIC PROCEDURE TODAY AT THE [facility]:   Refer to the procedure report that was given to you for any specific questions about what was found during the examination.  If the procedure report does not answer your questions, please call your gastroenterologist to clarify.  If you requested that your care partner not be given the details of your procedure findings, then the procedure report has been included in a sealed envelope for you to review at your convenience later.  YOU SHOULD EXPECT: Some feelings of bloating in the abdomen. Passage of more gas than usual.  Walking can help get rid of the air that was put into your GI tract during the procedure and reduce the bloating. If you had a lower endoscopy (such as a colonoscopy or flexible sigmoidoscopy) you may notice spotting of blood in your stool or on the toilet paper. If you underwent a bowel prep for your procedure, you may not have a normal bowel movement for a few days.  Please Note:  You might notice some irritation and congestion in your nose or some drainage.  This is from the oxygen used during your procedure.  There is no need for concern and it should clear up in a day or so.  SYMPTOMS TO REPORT IMMEDIATELY:  Following upper endoscopy (EGD)  Vomiting of blood or coffee ground material  New chest pain or pain under the  shoulder blades  Painful or persistently difficult swallowing  New shortness of breath  Fever of 100F or higher  Black, tarry-looking stools  For urgent or emergent issues, a gastroenterologist can be reached at any hour by calling [phone]. Do not use MyChart messaging for urgent concerns.    DIET:  We  do recommend a small meal at first, but then you may proceed to your regular diet.  Drink plenty of fluids but you should avoid alcoholic beverages for 24 hours.  ACTIVITY:  You should plan to take it easy for the rest of today and you should NOT DRIVE or use heavy machinery until tomorrow (because of the sedation medicines used during the test).    FOLLOW UP: Our staff will call the number listed on your records the next business day following your procedure.  We will call around 7:15- 8:00 am to check on you and address any questions or concerns that you may have regarding the information given to you following your procedure. If we do not reach you, we will leave a message.     If any biopsies were taken you will be contacted by phone or by letter within the next 1-3 weeks.  Please call us  at [phone] if you have not heard about the biopsies in 3 weeks.    SIGNATURES/CONFIDENTIALITY: You and/or your care partner have signed paperwork which will be entered into your electronic medical record.  These signatures attest to the fact that that the information above on your After Visit Summary has been reviewed and is understood.  Full responsibility of the confidentiality of this discharge information lies with you and/or your care-partner.

## 2023-12-11 NOTE — Progress Notes (Signed)
 Patient to call and schedule follow up visit with Dr. San. They had some questions about other appointments.

## 2023-12-15 ENCOUNTER — Telehealth: Payer: Self-pay | Admitting: *Deleted

## 2023-12-15 NOTE — Telephone Encounter (Signed)
  Follow up Call-     12/11/2023    9:09 AM  Call back number  Post procedure Call Back phone  # [phone]  Permission to leave phone message Yes     Patient questions:  Do you have a fever, pain , or abdominal swelling? No. Pain Score  0 *  Have you tolerated food without any problems? Yes.    Have you been able to return to your normal activities? Yes.    Do you have any questions about your discharge instructions: Diet   No. Medications  No. Follow up visit  No.  Do you have questions or concerns about your Care? No.  Actions: * If pain score is 4 or above: No action needed, pain <4.

## 2023-12-16 ENCOUNTER — Other Ambulatory Visit: Payer: Self-pay | Admitting: Gastroenterology

## 2023-12-16 ENCOUNTER — Telehealth: Payer: Self-pay

## 2023-12-16 DIAGNOSIS — R0989 Other specified symptoms and signs involving the circulatory and respiratory systems: Secondary | ICD-10-CM

## 2023-12-16 DIAGNOSIS — R09A2 Foreign body sensation, throat: Secondary | ICD-10-CM

## 2023-12-16 DIAGNOSIS — K219 Gastro-esophageal reflux disease without esophagitis: Secondary | ICD-10-CM

## 2023-12-16 LAB — SURGICAL PATHOLOGY

## 2023-12-16 NOTE — Telephone Encounter (Signed)
 I spoke with Sonya Clayton and they are still trying to locate the info for her previous GI Dr in [place] . S/P her EGD, Dr Shila wanted her to have an appointment with Dr Sandor Flatter to discuss possible TIF. This appointment has been set up for 02/11/2024 at 9:40AM.

## 2023-12-16 NOTE — Telephone Encounter (Signed)
 S/P EGD Dr Shila ordered an Esophageal manometry and 24 hour ph impedance study, off PPI x 7 days.   Patient aware of date/time/location: [facility] on 12/30/2023 at 1pm , arrive at 12:45pm . Instructions have been emailed (confirmed email) and mailed to her. Went over the main points on the phone and she also has MYCHART to see them. Case # [id number], booked with Luke. I spoke to Verona Walk at [facility] and she said to have the patient call the ENDO unit with any prep questions # [phone] and ask for Olam or Darice.

## 2023-12-23 NOTE — Telephone Encounter (Signed)
 We will await her to contact us  back with information to request her records.

## 2023-12-30 ENCOUNTER — Encounter (HOSPITAL_COMMUNITY)
Admission: RE | Disposition: A | Discharge status: Home / Self Care | Payer: Self-pay | Source: Home / Self Care | Attending: Gastroenterology

## 2023-12-30 ENCOUNTER — Ambulatory Visit (HOSPITAL_COMMUNITY)
Admission: RE | Admit: 2023-12-30 | Discharge: 2023-12-30 | Disposition: A | Discharge status: Home / Self Care | Attending: Gastroenterology | Admitting: Gastroenterology

## 2023-12-30 DIAGNOSIS — K2289 Other specified disease of esophagus: Secondary | ICD-10-CM

## 2023-12-30 DIAGNOSIS — K224 Dyskinesia of esophagus: Secondary | ICD-10-CM | POA: Diagnosis not present

## 2023-12-30 DIAGNOSIS — R1310 Dysphagia, unspecified: Secondary | ICD-10-CM

## 2023-12-30 DIAGNOSIS — K219 Gastro-esophageal reflux disease without esophagitis: Secondary | ICD-10-CM

## 2023-12-30 HISTORY — PX: 24 HOUR PH STUDY: SHX5419

## 2023-12-30 HISTORY — PX: ESOPHAGEAL MANOMETRY: SHX5429

## 2023-12-30 SURGERY — MANOMETRY, ESOPHAGUS

## 2023-12-30 MED ORDER — LIDOCAINE VISCOUS HCL 2 % MT SOLN
OROMUCOSAL | Status: AC
Start: 2023-12-30 — End: 2023-12-30
  Filled 2023-12-30: qty 15

## 2023-12-30 MED ORDER — LIDOCAINE VISCOUS HCL 2 % MT SOLN
OROMUCOSAL | Status: DC | PRN
  Administered 2023-12-30: 5 mL via OROMUCOSAL

## 2023-12-30 SURGICAL SUPPLY — 2 items
FACESHIELD LNG OPTICON STERILE (SAFETY) IMPLANT
GLOVE BIO SURGEON STRL SZ8 (GLOVE) ×2 IMPLANT

## 2023-12-30 NOTE — Progress Notes (Signed)
 Esophageal Manometry done per protocol. Pt tolerated well with out complication. Ph with impedance done per protocol. Pt tolerated well. Instructions given regarding the study and monitor. Pt verbalized understand and return demonstrated use of monitor. Pt will return tomorrow to have probe removed and monitor downloaded.

## 2023-12-31 ENCOUNTER — Encounter (HOSPITAL_COMMUNITY): Payer: Self-pay | Admitting: Gastroenterology

## 2024-02-10 ENCOUNTER — Ambulatory Visit: Payer: Self-pay | Admitting: Gastroenterology

## 2024-02-11 ENCOUNTER — Encounter: Payer: Self-pay | Admitting: Gastroenterology

## 2024-02-11 ENCOUNTER — Ambulatory Visit (INDEPENDENT_AMBULATORY_CARE_PROVIDER_SITE_OTHER): Admitting: Gastroenterology

## 2024-02-11 VITALS — BP 108/66 | HR 77 | Ht 62.5 in | Wt 142.2 lb

## 2024-02-11 DIAGNOSIS — R09A2 Foreign body sensation, throat: Secondary | ICD-10-CM | POA: Diagnosis not present

## 2024-02-11 DIAGNOSIS — J329 Chronic sinusitis, unspecified: Secondary | ICD-10-CM | POA: Diagnosis not present

## 2024-02-11 DIAGNOSIS — K219 Gastro-esophageal reflux disease without esophagitis: Secondary | ICD-10-CM | POA: Diagnosis not present

## 2024-02-11 DIAGNOSIS — K449 Diaphragmatic hernia without obstruction or gangrene: Secondary | ICD-10-CM

## 2024-02-11 NOTE — Patient Instructions (Signed)
 _______________________________________________________  If your blood pressure at your visit was 140/90 or greater, please contact your primary care physician to follow up on this.  _______________________________________________________  If you are age 44 or older, your body mass index should be between 23-30. Your Body mass index is 25.6 kg/m. If this is out of the aforementioned range listed, please consider follow up with your Primary Care Provider.  If you are age 43 or younger, your body mass index should be between 19-25. Your Body mass index is 25.6 kg/m. If this is out of the aformentioned range listed, please consider follow up with your Primary Care Provider.   ________________________________________________________  The [facility] providers would like to encourage you to use MYCHART to communicate with providers for non-urgent requests or questions.  Due to long hold times on the telephone, sending your provider a message by [facility] may be a faster and more efficient way to get a response.  Please allow 48 business hours for a response.  Please remember that this is for non-urgent requests.  _______________________________________________________  [facility] is using a team-based approach to care.  Your team is made up of your doctor and two to three APPS. Our APPS (Nurse Practitioners and Physician Assistants) work with your physician to ensure care continuity for you. They are fully qualified to address your health concerns and develop a treatment plan. They communicate directly with your gastroenterologist to care for you. Seeing the Advanced Practice Practitioners on your physician's team can help you by facilitating care more promptly, often allowing for earlier appointments, access to diagnostic testing, procedures, and other specialty referrals.   You will follow up in our office on an as needed basis.  It was a pleasure to see you today!  Vito Cirigliano,  D.O.

## 2024-02-11 NOTE — Progress Notes (Unsigned)
 Chief Complaint:    GERD   HPI:     Patient is a 44 y.o. female referred to me by Dr. Nandigam for evaluation of possible antireflux intervention with Transoral Incisionless Fundoplication (TIF) with a goal to stop or significantly reduce acid suppression therapy.  Longstanding history of GERD with LPR, diagnosed in 2015 while living in [place] .  Index symptoms of heartburn, but also has developed globus sensation and increased throat clearing over the years.  No dysphagia.  Has had issues with sinusitis and was previously told by her ENT that this was likely due to reflux.  Index EGD around that same time and diagnosed with GERD and started on Protonix  40 mg daily.  Has been using Protonix  since then with overall good control.  Occasional breakthrough with certain foods.  Started having epigastric pain and heartburn in spring 2025 which eventually resolved with addition of Pepcid  and Carafate  which she has since stopped.  She is concerned about the long-term use of PPIs and has a history of vitamin B12 and vitamin D  deficiency.  Since changing to Aciphex , sxs are overall well controlled. Globus sensation improved, but not resolved. Has also made dietary modifications.   GERD history: -Index symptoms: Heartburn, globus sensation, increased throat clearing.  No dysphagia -Exacerbating features: Spicy, greasy -Medications trialed: Protonix , Aciphex .  Brief course of Pepcid  and Carafate  in 2025 -Current medications: Aciphex  20 mg twice daily -Complications: Hiatal hernia, Vitamin B12 and vitamin D  deficiency  GERD evaluation: -Last EGD: 11/2023 -Barium esophagram: None -Esophageal Manometry: 12/2023: Esophageal contractions were 50% peristaltic with normal distal latency and DCI.  50% esophageal contractions were ineffective with weak and failed contractions.  Complete bolus clearance in 5/10 swallows.  Normal relaxation and contractile phase with multiple rapid swallow sequence indicating  adequate contractile reserve.  Normal EGJ relaxation. -pH/Impedance: 12/2023 (Off PPI therapy): Normal esophageal acid exposure at 1.9%.  DeMeester score normal at 7.7.  Esophageal acid exposure normal in upright and supine position.  Elevated number of reflux events, predominantly acid and weakly acid with positive symptom correlation for heartburn based on SAP.  Overall, this study is consistent with increased gastroesophageal reflux episodes and possibly overlapping esophageal hypersensitivity given good symptom correlation but otherwise normal esophageal acid exposure. -Bravo: N/A  Endoscopic History: - 2015: EGD in [place]  and told she had reflux changes.  No report available for review - 2018/2019: EGD in [place]  without ulcers or esophagitis per patient.  No port available for review - 12/11/2023: EGD: 2 cm hiatal hernia, Hill grade 3 valve, otherwise normal esophagus (path benign) and normal stomach and duodenum.  Changed Protonix  to Aciphex  20 mg twice daily   GERD-HRQL Questionnaire Score:    Review of systems:     No chest pain, no SOB, no fevers, no urinary sx   Past Medical History:  Diagnosis Date   Chicken pox    GERD (gastroesophageal reflux disease)    Gestational diabetes    Infertility, female    Laryngopharyngeal reflux    Recurrent sinusitis    Thyroid  disease    Vegetarian diet     Patient's surgical history, family medical history, social history, medications and allergies were all reviewed in Epic    Current Outpatient Medications  Medication Sig Dispense Refill   Cholecalciferol (VITAMIN D3) 50 MCG (2000 UT) TABS Take 1 tablet by mouth daily.     fluticasone  (FLONASE ) 50 MCG/ACT nasal spray SPRAY 2 SPRAYS INTO EACH NOSTRIL EVERY DAY 16 g 2   Multiple  Vitamin (MULTIVITAMIN) capsule Take 1 capsule by mouth daily.     RABEprazole  (ACIPHEX ) 20 MG tablet Take 1 tablet (20 mg total) by mouth 2 (two) times daily at 8 am and 10 pm. 180 tablet 3   vitamin B-12  (CYANOCOBALAMIN ) 100 MCG tablet Take 100 mcg by mouth daily.     No current facility-administered medications for this visit.    Physical Exam:     There were no vitals taken for this visit.  GENERAL:  Pleasant *** in NAD PSYCH: : Cooperative, normal affect EENT:  conjunctiva pink, mucous membranes moist, neck supple without masses CARDIAC:  RRR, ***murmur heard, no peripheral edema PULM: Normal respiratory effort, lungs CTA bilaterally, no wheezing ABDOMEN:  Nondistended, soft, nontender. No obvious masses, no hepatomegaly,  normal bowel sounds SKIN:  turgor, no lesions seen Musculoskeletal:  Normal muscle tone, normal strength NEURO: Alert and oriented x 3, no focal neurologic deficits   IMPRESSION and PLAN:    1)   pH/impedance results reviewed.  Elevated number of reflux episodes (93) which were predominantly acidic (40) and weakly acidic (50).  Additionally, good symptom correlation for heartburn with reflux episodes which were essentially all acid related with SAP 100%.  However, total esophageal acid exposure did not meet threshold of abnormality with acid exposure time 1.9% and DeMeester score 7.7.  This is a bit of a confounding result.  Additionally, the esophageal manometry was abnormal.    Wants to hold off for now *** VN *** [id number]        Sandor LULLA Flatter ,DO, FACG 02/11/2024, 9:27 AM

## 2024-02-22 ENCOUNTER — Ambulatory Visit: Admitting: Family Medicine

## 2024-02-22 ENCOUNTER — Encounter: Payer: Self-pay | Admitting: Family Medicine

## 2024-02-22 VITALS — BP 118/76 | HR 77 | Temp 97.9°F | Wt 144.0 lb

## 2024-02-22 DIAGNOSIS — J0100 Acute maxillary sinusitis, unspecified: Secondary | ICD-10-CM

## 2024-02-22 MED ORDER — CEFDINIR 300 MG PO CAPS: 300.0000 mg | ORAL_CAPSULE | Freq: Two times a day (BID) | ORAL | 0 refills | Status: DC

## 2024-02-22 NOTE — Patient Instructions (Signed)

## 2024-02-22 NOTE — Progress Notes (Signed)
 Sonya Clayton , 19-Nov-1980, 43 y.o., female MRN: [id number] Patient Care Team    Relationship Specialty Notifications Start End  Catherine Charlies LABOR, DO PCP - General Family Medicine  11/20/17   Jannis Kate Norris, MD Consulting Physician Obstetrics and Gynecology  01/03/20   Spainhour, Alm RAMAN, PA  Otolaryngology  05/05/23     Chief Complaint  Patient presents with   Nasal Congestion    Over 2 weeks; sinus pressure, congestion, HA. Pt has not tried anything to relieve sx.      Subjective: Sonya Clayton is a 43 y.o. Pt presents for an OV with complaints of ear pain, frontal headache of > 10 days duration.   Associated symptoms include congestion. Patient has tried over-the-counter remedies. She had a sinus infection this time last year, last sinus infection was again in June. Please see ROS     05/05/2023   10:45 AM 03/21/2022    1:47 PM 05/01/2021   10:35 AM 03/12/2021    1:33 PM 12/10/2020    1:38 PM  Depression screen PHQ 2/9  Decreased Interest 0 0 0 0 0  Down, Depressed, Hopeless 0 0 0 0 0  PHQ - 2 Score 0 0 0 0 0    Allergies  Allergen Reactions   Penicillins Hives   Social History   Social History Narrative   Marital status/children/pets: married, 2 children   Education/employment: B.S. Electrical eng.    Safety:      -Wears a bicycle helmet riding a bike: No     -smoke alarm in the home:Yes     - wears seatbelt: Yes     - Feels safe in their relationships: Yes   Past Medical History:  Diagnosis Date   Chicken pox    GERD (gastroesophageal reflux disease)    Gestational diabetes    Infertility, female    Laryngopharyngeal reflux    Recurrent sinusitis    Thyroid  disease    Vegetarian diet    Past Surgical History:  Procedure Laterality Date   49 HOUR PH STUDY N/A 12/30/2023   Procedure: MONITORING, ESOPHAGEAL PH, 24 HOUR;  Surgeon: Shila Gustav GAILS, MD;  Location: [facility];  Service: Gastroenterology;  Laterality: N/A;   ESOPHAGEAL  MANOMETRY N/A 12/30/2023   Procedure: MANOMETRY, ESOPHAGUS;  Surgeon: Shila Gustav GAILS, MD;  Location: [facility];  Service: Gastroenterology;  Laterality: N/A;   WISDOM TOOTH EXTRACTION     Family History  Problem Relation Age of Onset   Hypertension Mother    Hypertension Father    Breast cancer Paternal Grandmother    Diabetes Paternal Grandmother    Hypertension Paternal Grandmother    Hypertension Paternal Grandfather    Liver disease Neg Hx    Esophageal cancer Neg Hx    Colon cancer Neg Hx    Allergies as of 02/22/2024       Reactions   Penicillins Hives        Medication List        Accurate as of February 22, 2024  2:08 PM. If you have any questions, ask your nurse or doctor.          cefdinir  300 MG capsule Commonly known as: OMNICEF  Take 1 capsule (300 mg total) by mouth 2 (two) times daily. Started by: Juliano Mceachin   fluticasone  50 MCG/ACT nasal spray Commonly known as: FLONASE  SPRAY 2 SPRAYS INTO EACH NOSTRIL EVERY DAY   multivitamin capsule Take 1 capsule by mouth daily.  RABEprazole  20 MG tablet Commonly known as: ACIPHEX  Take 1 tablet (20 mg total) by mouth 2 (two) times daily at 8 am and 10 pm.   vitamin B-12 100 MCG tablet Commonly known as: CYANOCOBALAMIN  Take 100 mcg by mouth daily.   Vitamin D3 50 MCG (2000 UT) Tabs Take 1 tablet by mouth daily.        All past medical history, surgical history, allergies, family history, immunizations andmedications were updated in the EMR today and reviewed under the history and medication portions of their EMR.     Review of Systems  Constitutional:  Positive for malaise/fatigue. Negative for chills and fever.  HENT:  Positive for congestion, ear pain and sinus pain. Negative for ear discharge and sore throat.   Eyes: Negative.   Respiratory:  Negative for cough, sputum production, shortness of breath and wheezing.   Cardiovascular:  Negative for chest pain.  Gastrointestinal:  Negative  for nausea and vomiting.  Musculoskeletal:  Negative for myalgias.  Skin:  Negative for rash.  Neurological:  Positive for headaches. Negative for dizziness.   Negative, with the exception of above mentioned in HPI   Objective:  BP 118/76   Pulse 77   Temp 97.9 F (36.6 C)   Wt 144 lb (65.3 kg)   SpO2 98%   BMI 25.92 kg/m  Body mass index is 25.92 kg/m. Physical Exam Vitals and nursing note reviewed.  Constitutional:      General: She is not in acute distress.    Appearance: Normal appearance. She is normal weight. She is not ill-appearing or toxic-appearing.  HENT:     Head: Normocephalic and atraumatic.     Right Ear: Tympanic membrane, ear canal and external ear normal. There is no impacted cerumen.     Left Ear: Tympanic membrane, ear canal and external ear normal. There is no impacted cerumen.     Nose: Congestion and rhinorrhea present.     Comments: Erythema bilateral nostrils    Mouth/Throat:     Mouth: Mucous membranes are moist.     Pharynx: Posterior oropharyngeal erythema present. No oropharyngeal exudate.  Eyes:     General: No scleral icterus.       Right eye: No discharge.        Left eye: No discharge.     Extraocular Movements: Extraocular movements intact.     Conjunctiva/sclera: Conjunctivae normal.     Pupils: Pupils are equal, round, and reactive to light.  Cardiovascular:     Rate and Rhythm: Normal rate and regular rhythm.     Heart sounds: No murmur heard. Pulmonary:     Effort: Pulmonary effort is normal. No respiratory distress.     Breath sounds: Normal breath sounds. No wheezing, rhonchi or rales.  Musculoskeletal:     Cervical back: Neck supple.  Lymphadenopathy:     Cervical: No cervical adenopathy.  Skin:    Findings: No rash.  Neurological:     Mental Status: She is alert and oriented to person, place, and time. Mental status is at baseline.     Motor: No weakness.     Coordination: Coordination normal.     Gait: Gait normal.   Psychiatric:        Mood and Affect: Mood normal.        Behavior: Behavior normal.        Thought Content: Thought content normal.        Judgment: Judgment normal.     No results found. No  results found. No results found for this or any previous visit (from the past 24 hours).  Assessment/Plan: Sonya Clayton is a 43 y.o. female present for OV for  Acute non-recurrent maxillary sinusitis (Primary) Rest, hydrate.  Restart  flonase ,  nettie pot or nasal saline.  Humidifier use recommended omnicef  prescribed, take until completed.  F/U 2 weeks of not improved.   Reviewed expectations re: course of current medical issues. Discussed self-management of symptoms. Outlined signs and symptoms indicating need for more acute intervention. Patient verbalized understanding and all questions were answered. Patient received an After-Visit Summary.    No orders of the defined types were placed in this encounter.  Meds ordered this encounter  Medications   cefdinir  (OMNICEF ) 300 MG capsule    Sig: Take 1 capsule (300 mg total) by mouth 2 (two) times daily.    Dispense:  20 capsule    Refill:  0   Referral Orders  No referral(s) requested today     Note is dictated utilizing voice recognition software. Although note has been proof read prior to signing, occasional typographical errors still can be missed. If any questions arise, please do not hesitate to call for verification.   electronically signed by:  Charlies Bellini, DO  [facility] - [place]

## 2024-03-07 ENCOUNTER — Ambulatory Visit: Payer: Self-pay

## 2024-03-07 NOTE — Telephone Encounter (Signed)
 noted

## 2024-03-07 NOTE — Telephone Encounter (Signed)
 FYI Only or Action Required?: FYI only for provider: appointment scheduled on 03/08/24.  Patient was last seen in primary care on 02/22/2024 by Sonya Fuller A, DO.  Called Nurse Triage reporting Sinusitis.  Symptoms began several weeks ago.  Interventions attempted: Prescription medications: completed antibiotic.  Symptoms are: gradually improving.  Triage Disposition: See Physician Within 24 Hours  Patient/caregiver understands and will follow disposition?: Yes   Copied from CRM #[id number]. Topic: Clinical - Red Word Triage >> Mar 07, 2024 11:09 AM Aleatha BROCKS wrote: Red Word that prompted transfer to Nurse Triage: Patient came in for sinus infection and got antibiotics but the infection has gotten worst Reason for Disposition  [1] Taking antibiotic > 48 hours (2 days) AND [2] fever persists  Answer Assessment - Initial Assessment Questions 1. ANTIBIOTIC: What antibiotic are you taking? How many times Clayton day?     Completed 2. ONSET: When was the antibiotic started?     02/23/24 X 10 days 3. PAIN: How bad is the pain?   (Scale 0-10; or none, mild, moderate or severe)     Sinus pain has resolved 4. FEVER: Do you have Clayton fever? If Yes, ask: What is it, how was it measured, and when did it start?      Feels chills and feverish  5. SYMPTOMS: Are there any other symptoms you're concerned about? If Yes, ask: When did it start?     Hot flash, body ache, malaise.  6. PREGNANCY: Is there any chance you are pregnant? When was your last menstrual period?  Protocols used: Sinus Infection on Antibiotic Follow-up Call-Clayton-AH

## 2024-03-08 ENCOUNTER — Ambulatory Visit (INDEPENDENT_AMBULATORY_CARE_PROVIDER_SITE_OTHER): Admitting: Family Medicine

## 2024-03-08 ENCOUNTER — Encounter: Payer: Self-pay | Admitting: Family Medicine

## 2024-03-08 VITALS — BP 118/78 | HR 75 | Temp 98.2°F | Wt 143.6 lb

## 2024-03-08 DIAGNOSIS — B999 Unspecified infectious disease: Secondary | ICD-10-CM | POA: Diagnosis not present

## 2024-03-08 DIAGNOSIS — R5383 Other fatigue: Secondary | ICD-10-CM | POA: Diagnosis not present

## 2024-03-08 DIAGNOSIS — J329 Chronic sinusitis, unspecified: Secondary | ICD-10-CM | POA: Diagnosis not present

## 2024-03-08 DIAGNOSIS — J3089 Other allergic rhinitis: Secondary | ICD-10-CM

## 2024-03-08 LAB — COMPREHENSIVE METABOLIC PANEL WITH GFR
ALT: 25 U/L (ref 0–35)
AST: 24 U/L (ref 0–37)
Albumin: 4.5 g/dL (ref 3.5–5.2)
Alkaline Phosphatase: 48 U/L (ref 39–117)
BUN: 7 mg/dL (ref 6–23)
CO2: 26 meq/L (ref 19–32)
Calcium: 10.2 mg/dL (ref 8.4–10.5)
Chloride: 106 meq/L (ref 96–112)
Creatinine, Ser: 0.7 mg/dL (ref 0.40–1.20)
GFR: 105.36 mL/min (ref >60.00)
Glucose, Bld: 90 mg/dL (ref 70–99)
Potassium: 4.9 meq/L (ref 3.5–5.1)
Sodium: 137 meq/L (ref 135–145)
Total Bilirubin: 0.4 mg/dL (ref 0.2–1.2)
Total Protein: 7.3 g/dL (ref 6.0–8.3)

## 2024-03-08 LAB — CBC WITH DIFFERENTIAL/PLATELET
Basophils Absolute: 0.1 K/uL (ref 0.0–0.1)
Basophils Relative: 1 % (ref 0.0–3.0)
Eosinophils Absolute: 0.1 K/uL (ref 0.0–0.7)
Eosinophils Relative: 1 % (ref 0.0–5.0)
HCT: 39.8 % (ref 36.0–46.0)
Hemoglobin: 13.7 g/dL (ref 12.0–15.0)
Lymphocytes Relative: 25.2 % (ref 12.0–46.0)
Lymphs Abs: 1.8 K/uL (ref 0.7–4.0)
MCHC: 34.3 g/dL (ref 30.0–36.0)
MCV: 84.8 fl (ref 78.0–100.0)
Monocytes Absolute: 0.5 K/uL (ref 0.1–1.0)
Monocytes Relative: 6.9 % (ref 3.0–12.0)
Neutro Abs: 4.7 K/uL (ref 1.4–7.7)
Neutrophils Relative %: 65.9 % (ref 43.0–77.0)
Platelets: 236 K/uL (ref 150.0–400.0)
RBC: 4.69 Mil/uL (ref 3.87–5.11)
RDW: 13.3 % (ref 11.5–15.5)
WBC: 7.1 K/uL (ref 4.0–10.5)

## 2024-03-08 LAB — TSH: TSH: 2.72 u[IU]/mL (ref 0.35–5.50)

## 2024-03-08 MED ORDER — PREDNISONE 20 MG PO TABS: 40.0000 mg | ORAL_TABLET | Freq: Every day | ORAL | 0 refills | Status: AC

## 2024-03-08 MED ORDER — DOXYCYCLINE HYCLATE 100 MG PO TABS: 100.0000 mg | ORAL_TABLET | Freq: Two times a day (BID) | ORAL | 0 refills | Status: AC

## 2024-03-08 MED ORDER — MONTELUKAST SODIUM 10 MG PO TABS: 10.0000 mg | ORAL_TABLET | Freq: Every day | ORAL | 3 refills | Status: DC

## 2024-03-08 MED ORDER — CETIRIZINE HCL 10 MG PO TABS: 10.0000 mg | ORAL_TABLET | Freq: Every day | ORAL | 3 refills | Status: AC

## 2024-03-08 NOTE — Progress Notes (Addendum)
 Sonya Clayton , Nov 22, 1980, 43 y.o., female MRN: [id number] Patient Care Team    Relationship Specialty Notifications Start End  Catherine Charlies LABOR, DO PCP - General Family Medicine  11/20/17   Jannis Kate Norris, MD Consulting Physician Obstetrics and Gynecology  01/03/20   Spainhour, Alm RAMAN, PA  Otolaryngology  05/05/23     Chief Complaint  Patient presents with   Nasal Congestion    Ongoing congestion, HA, body aches. Still currently taking Cefdinir .      Subjective: Sonya Clayton is a 43 y.o. Pt presents for persistent cough, congestion and bodyaches.  She was seen 02/22/2024 treated with cefdinir .  Patient reports her symptoms never improved.  She complains of frontal headache and bodyaches today.  No fevers or chills.  Feeling very fatigued.  She is not currently taking any antihistamines.  She has been established with ENT in the past. Prior note An OV with complaints of ear pain, frontal headache of > 10 days duration.   Associated symptoms include congestion. Patient has tried over-the-counter remedies. She had a sinus infection this time last year, last sinus infection was again in June. Please see ROS     05/05/2023   10:45 AM 03/21/2022    1:47 PM 05/01/2021   10:35 AM 03/12/2021    1:33 PM 12/10/2020    1:38 PM  Depression screen PHQ 2/9  Decreased Interest 0 0 0 0 0  Down, Depressed, Hopeless 0 0 0 0 0  PHQ - 2 Score 0 0 0 0 0    Allergies  Allergen Reactions   Penicillins Hives   Social History   Social History Narrative   Marital status/children/pets: married, 2 children   Education/employment: B.S. Electrical eng.    Safety:      -Wears a bicycle helmet riding a bike: No     -smoke alarm in the home:Yes     - wears seatbelt: Yes     - Feels safe in their relationships: Yes   Past Medical History:  Diagnosis Date   Chicken pox    GERD (gastroesophageal reflux disease)    Gestational diabetes    Infertility, female    Laryngopharyngeal  reflux    Recurrent sinusitis    Thyroid  disease    Vegetarian diet    Past Surgical History:  Procedure Laterality Date   78 HOUR PH STUDY N/A 12/30/2023   Procedure: MONITORING, ESOPHAGEAL PH, 24 HOUR;  Surgeon: Shila Gustav GAILS, MD;  Location: [facility];  Service: Gastroenterology;  Laterality: N/A;   ESOPHAGEAL MANOMETRY N/A 12/30/2023   Procedure: MANOMETRY, ESOPHAGUS;  Surgeon: Shila Gustav GAILS, MD;  Location: [facility];  Service: Gastroenterology;  Laterality: N/A;   WISDOM TOOTH EXTRACTION     Family History  Problem Relation Age of Onset   Hypertension Mother    Hypertension Father    Breast cancer Paternal Grandmother    Diabetes Paternal Grandmother    Hypertension Paternal Grandmother    Hypertension Paternal Grandfather    Liver disease Neg Hx    Esophageal cancer Neg Hx    Colon cancer Neg Hx    Allergies as of 03/08/2024       Reactions   Penicillins Hives        Medication List        Accurate as of March 08, 2024 12:48 PM. If you have any questions, ask your nurse or doctor.          STOP taking these  medications    cefdinir  300 MG capsule Commonly known as: OMNICEF  Stopped by: Charlies Bellini       TAKE these medications    cetirizine  10 MG tablet Commonly known as: ZYRTEC  Take 1 tablet (10 mg total) by mouth at bedtime. Started by: Landen Breeland   doxycycline  100 MG tablet Commonly known as: VIBRA -TABS Take 1 tablet (100 mg total) by mouth 2 (two) times daily. Started by: Hien Perreira   fluticasone  50 MCG/ACT nasal spray Commonly known as: FLONASE  SPRAY 2 SPRAYS INTO EACH NOSTRIL EVERY DAY   montelukast  10 MG tablet Commonly known as: SINGULAIR  Take 1 tablet (10 mg total) by mouth at bedtime. Started by: Rylynn Schoneman   multivitamin capsule Take 1 capsule by mouth daily.   predniSONE  20 MG tablet Commonly known as: DELTASONE  Take 2 tablets (40 mg total) by mouth daily with breakfast for 5 days. Started by: Charlies Bellini   RABEprazole  20 MG tablet Commonly known as: ACIPHEX  Take 1 tablet (20 mg total) by mouth 2 (two) times daily at 8 am and 10 pm.   vitamin B-12 100 MCG tablet Commonly known as: CYANOCOBALAMIN  Take 100 mcg by mouth daily.   Vitamin D3 50 MCG (2000 UT) Tabs Take 1 tablet by mouth daily.        All past medical history, surgical history, allergies, family history, immunizations andmedications were updated in the EMR today and reviewed under the history and medication portions of their EMR.     Review of Systems  Constitutional:  Positive for malaise/fatigue. Negative for chills and fever.  HENT:  Positive for congestion, ear pain and sinus pain. Negative for ear discharge and sore throat.   Eyes:  Positive for pain. Negative for discharge and redness.  Respiratory:  Negative for cough, sputum production, shortness of breath and wheezing.   Cardiovascular:  Negative for chest pain.  Gastrointestinal:  Negative for abdominal pain, diarrhea, nausea and vomiting.  Musculoskeletal:  Positive for myalgias.  Skin:  Negative for rash.  Neurological:  Positive for headaches. Negative for dizziness.   Negative, with the exception of above mentioned in HPI   Objective:  BP 118/78   Pulse 75   Temp 98.2 F (36.8 C)   Wt 143 lb 9.6 oz (65.1 kg)   LMP 03/04/2024   SpO2 98%   BMI 25.85 kg/m  Body mass index is 25.85 kg/m. Physical Exam Vitals and nursing note reviewed.  Constitutional:      General: She is not in acute distress.    Appearance: Normal appearance. She is normal weight. She is not ill-appearing or toxic-appearing.  HENT:     Head: Normocephalic and atraumatic.     Right Ear: Tympanic membrane, ear canal and external ear normal. There is no impacted cerumen.     Left Ear: Tympanic membrane, ear canal and external ear normal. There is no impacted cerumen.     Nose: Congestion present. No rhinorrhea.     Right Turbinates: Enlarged.     Right Sinus: Frontal  sinus tenderness present.     Left Sinus: Frontal sinus tenderness present.     Mouth/Throat:     Mouth: Mucous membranes are moist.     Pharynx: No oropharyngeal exudate or posterior oropharyngeal erythema.  Eyes:     General: No scleral icterus.       Right eye: No discharge.        Left eye: No discharge.     Extraocular Movements: Extraocular movements intact.  Conjunctiva/sclera: Conjunctivae normal.     Pupils: Pupils are equal, round, and reactive to light.  Cardiovascular:     Rate and Rhythm: Normal rate and regular rhythm.     Heart sounds: No murmur heard. Pulmonary:     Effort: Pulmonary effort is normal. No respiratory distress.     Breath sounds: Normal breath sounds. No wheezing, rhonchi or rales.  Musculoskeletal:     Cervical back: Neck supple.  Lymphadenopathy:     Cervical: No cervical adenopathy.  Skin:    Findings: No rash.  Neurological:     Mental Status: She is alert and oriented to person, place, and time. Mental status is at baseline.     Motor: No weakness.     Coordination: Coordination normal.     Gait: Gait normal.  Psychiatric:        Mood and Affect: Mood normal.        Behavior: Behavior normal.        Thought Content: Thought content normal.        Judgment: Judgment normal.     No results found. No results found. No results found for this or any previous visit (from the past 24 hours).  Assessment/Plan: Sonya Clayton is a 43 y.o. female present for OV for  Recurrent sinusitis/fatigue/perennial rhinitis/chronic rhinitis Rest, hydrate.  Restart  flonase ,  nettie pot or nasal saline.  Start the Zyrtec  10 mg nightly> patient was encouraged to continue this year-round Start Singulair  10 mg nightly> patient was encouraged to continue this year-round Placed referral back to her ENT for further evaluation CBC, CMP and TSH collected today Doxycycline  twice daily x 10 days Prednisone  burst Follow-up in 2 weeks if not improving .    Reviewed expectations re: course of current medical issues. Discussed self-management of symptoms. Outlined signs and symptoms indicating need for more acute intervention. Patient verbalized understanding and all questions were answered. Patient received an After-Visit Summary.    Orders Placed This Encounter  Procedures   CBC w/Diff   TSH   Comp Met (CMET)   Ambulatory referral to ENT   Meds ordered this encounter  Medications   cetirizine  (ZYRTEC ) 10 MG tablet    Sig: Take 1 tablet (10 mg total) by mouth at bedtime.    Dispense:  90 tablet    Refill:  3   montelukast  (SINGULAIR ) 10 MG tablet    Sig: Take 1 tablet (10 mg total) by mouth at bedtime.    Dispense:  90 tablet    Refill:  3   doxycycline  (VIBRA -TABS) 100 MG tablet    Sig: Take 1 tablet (100 mg total) by mouth 2 (two) times daily.    Dispense:  20 tablet    Refill:  0   predniSONE  (DELTASONE ) 20 MG tablet    Sig: Take 2 tablets (40 mg total) by mouth daily with breakfast for 5 days.    Dispense:  10 tablet    Refill:  0   Referral Orders         Ambulatory referral to ENT       Note is dictated utilizing voice recognition software. Although note has been proof read prior to signing, occasional typographical errors still can be missed. If any questions arise, please do not hesitate to call for verification.   electronically signed by:  Charlies Bellini, DO  [facility] - [place]

## 2024-03-08 NOTE — Patient Instructions (Signed)
 Spainhour, Alm Cassis, PA-C  [address]  [address]  [place], [place] [address]  [phone] (Work) >> call ENT to schedule    No follow-ups on file.        Great to see you today.  I have refilled the medication(s) we provide.   If labs were collected or images ordered, we will inform you of  results once we have received them and reviewed. We will contact you either by echart message, or telephone call.  Please give ample time to the testing facility, and our office to run,  receive and review results. Please do not call inquiring of results, even if you can see them in your chart. We will contact you as soon as we are able. If it has been over 1 week since the test was completed, and you have not yet heard from us , then please call us .    - echart message- for normal results that have been seen by the patient already.   - telephone call: abnormal results or if patient has not viewed results in their echart.  If a referral to a specialist was entered for you, please call us  in 2 weeks if you have not heard from the specialist office to schedule.

## 2024-03-09 ENCOUNTER — Ambulatory Visit: Payer: Self-pay | Admitting: Family Medicine

## 2024-03-09 ENCOUNTER — Other Ambulatory Visit (HOSPITAL_COMMUNITY): Payer: Self-pay

## 2024-03-09 ENCOUNTER — Telehealth: Payer: Self-pay

## 2024-03-09 NOTE — Telephone Encounter (Signed)
 Pharmacy Patient Advocate Encounter   Received notification from Onbase that prior authorization for montelukast  (SINGULAIR ) tablet 10 mg  is required/requested.   Insurance verification completed.   The patient is insured through [facility].   Per test claim: PA required; PA submitted to above mentioned insurance via Latent Key/confirmation #/EOC [facility] Status is pending

## 2024-03-15 DIAGNOSIS — J3089 Other allergic rhinitis: Secondary | ICD-10-CM | POA: Insufficient documentation

## 2024-03-15 MED ORDER — MONTELUKAST SODIUM 10 MG PO TABS: 10.0000 mg | ORAL_TABLET | Freq: Every day | ORAL | 3 refills | Status: AC

## 2024-03-15 NOTE — Telephone Encounter (Signed)
 Pharmacy Patient Advocate Encounter  Received notification from [facility] that Prior Authorization for Montelukast  Sodium 10MG  tablets  has been DENIED.  See denial reason below. No denial letter attached in CMM. Will attach denial letter to Media tab once received.  PLEASE BE ADVISED Your plan only covers this drug when it is used for certain health conditions. Covered uses are for: A) Asthma, B) Exercise-induced bronchoconstriction (EIB), or C) Seasonal or perennial allergic rhinitis  PA #/Case ID/Reference #: [id number]

## 2024-03-15 NOTE — Addendum Note (Signed)
 Addended by: Ella Guillotte A on: 03/15/2024 12:40 PM   Modules accepted: Orders

## 2024-03-15 NOTE — Telephone Encounter (Signed)
 Resubmitted the medication.  The denial request is not valid since the medication is being used for the indications that are covered by her insurance company . it is being used for seasonal and perennial allergic rhinitis

## 2024-03-31 ENCOUNTER — Ambulatory Visit: Payer: Self-pay

## 2024-03-31 NOTE — Telephone Encounter (Signed)
 FYI Only or Action Required?: FYI only for provider: appointment scheduled on 04/01/24.  Patient was last seen in primary care on 03/08/2024 by Catherine Fuller A, DO.  Called Nurse Triage reporting Headache.  Symptoms began a week ago.  Interventions attempted: OTC medications: ibuprofen.  Symptoms are: unchanged.  Triage Disposition: See Physician Within 24 Hours  Patient/caregiver understands and will follow disposition?: Yes   Copied from CRM #[id number]. Topic: Clinical - Red Word Triage >> Mar 31, 2024  8:17 AM Deleta RAMAN wrote: Red Word that prompted transfer to Nurse Triage: patient states she has muscle pain in the back of her head that has worsen Reason for Disposition  [1] MODERATE headache (e.g., interferes with normal activities) AND [2] present > 24 hours AND [3] unexplained  (Exceptions: Pain medicines not tried, typical migraine, or headache part of viral illness.)  Answer Assessment - Initial Assessment Questions 1. LOCATION: Where does it hurt?      Headache back left 2. ONSET: When did the headache start? (e.g., minutes, hours, days)      thursday 3. PATTERN: Does the pain come and go, or has it been constant since it started?     constant 4. SEVERITY: How bad is the pain? and What does it keep you from doing?  (e.g., Scale 1-10; mild, moderate, or severe)     7/10 with movement 5. RECURRENT SYMPTOM: Have you ever had headaches before? If Yes, ask: When was the last time? and What happened that time?      no 6. CAUSE: What do you think is causing the headache?     unknown 7. MIGRAINE: Have you been diagnosed with migraine headaches? If Yes, ask: Is this headache similar?      denies 8. HEAD INJURY: Has there been any recent injury to your head?      denies 9. OTHER SYMPTOMS: Do you have any other symptoms? (e.g., fever, stiff neck, eye pain, sore throat, cold symptoms)     denies 10. PREGNANCY: Is there any chance you are pregnant?  When was your last menstrual period?       no  Protocols used: Headache-A-AH

## 2024-03-31 NOTE — Telephone Encounter (Signed)
 FYI reviewed. Pt has scheduled appt tomorrow with PCP

## 2024-04-01 ENCOUNTER — Ambulatory Visit (INDEPENDENT_AMBULATORY_CARE_PROVIDER_SITE_OTHER): Admitting: Family Medicine

## 2024-04-01 ENCOUNTER — Encounter: Payer: Self-pay | Admitting: Family Medicine

## 2024-04-01 VITALS — BP 120/80 | HR 71 | Temp 97.9°F | Ht 62.5 in | Wt 142.0 lb

## 2024-04-01 DIAGNOSIS — M5481 Occipital neuralgia: Secondary | ICD-10-CM

## 2024-04-01 MED ORDER — METHYLPREDNISOLONE 4 MG PO TBPK: ORAL_TABLET | ORAL | 0 refills | Status: AC

## 2024-04-01 MED ORDER — CYCLOBENZAPRINE HCL 5 MG PO TABS: 5.0000 mg | ORAL_TABLET | Freq: Three times a day (TID) | ORAL | 1 refills | Status: AC | PRN

## 2024-04-01 MED ORDER — METHYLPREDNISOLONE ACETATE 80 MG/ML IJ SUSP
80.0000 mg | Freq: Once | INTRAMUSCULAR | Status: AC
  Administered 2024-04-01: 80 mg via INTRAMUSCULAR

## 2024-04-01 NOTE — Progress Notes (Signed)
 "      Sonya Clayton , 1980/09/12, 43 y.o., female MRN: [id number] Patient Care Team    Relationship Specialty Notifications Start End  Catherine Charlies LABOR, DO PCP - General Family Medicine  11/20/17   Jannis Kate Norris, MD Consulting Physician Obstetrics and Gynecology  01/03/20   [place], [place]m [place], [place]  Otolaryngology  05/05/23     Chief Complaint  Patient presents with   Headache    One week follow-up      Subjective: Sonya Clayton is a 43 y.o. Pt presents for an OV with complaints of headache of 1 week duration.  Associated symptoms include burning pain-patient points his location of pain from occiput to anterior parietal area on the left side of her head.  She denies any rash formation.  Denies dizziness, photophobia, nausea or vomiting.  Patient reports no visual changes.  Patient was evaluated 03/28/2024 ENT with nasal sinus endoscopy resulting with: Nasal turbinates bilaterally, nasal airway is narrowed, no polyps.  Mild deviation in the left septum.  No signs of chronic sinusitis.  CT 07/25/2022 with no signs of acute sinusitis trace mucus thickening within bilateral maxillary sinus.  During ENT visit she described her symptoms more as reflux and tension headache.  Patient reports she had reflux and the tension headache during her visit, but those were not her complaints for the ENT.     05/05/2023   10:45 AM 03/21/2022    1:47 PM 05/01/2021   10:35 AM 03/12/2021    1:33 PM 12/10/2020    1:38 PM  Depression screen PHQ 2/9  Decreased Interest 0 0 0 0 0  Down, Depressed, Hopeless 0 0 0 0 0  PHQ - 2 Score 0 0 0 0 0    Allergies[1] Social History   Social History Narrative   Marital status/children/pets: married, 2 children   Education/employment: B.S. Electrical eng.    Safety:      -Wears a bicycle helmet riding a bike: No     -smoke alarm in the home:Yes     - wears seatbelt: Yes     - Feels safe in their relationships: Yes   Past Medical History:  Diagnosis Date    Chicken pox    GERD (gastroesophageal reflux disease)    Gestational diabetes    Infertility, female    Laryngopharyngeal reflux    Recurrent sinusitis    Thyroid  disease    Vegetarian diet    Past Surgical History:  Procedure Laterality Date   69 HOUR PH STUDY N/A 12/30/2023   Procedure: MONITORING, ESOPHAGEAL PH, 24 HOUR;  Surgeon: Shila Gustav GAILS, MD;  Location: [facility];  Service: Gastroenterology;  Laterality: N/A;   ESOPHAGEAL MANOMETRY N/A 12/30/2023   Procedure: MANOMETRY, ESOPHAGUS;  Surgeon: Shila Gustav GAILS, MD;  Location: [facility];  Service: Gastroenterology;  Laterality: N/A;   WISDOM TOOTH EXTRACTION     Family History  Problem Relation Age of Onset   Hypertension Mother    Hypertension Father    Breast cancer Paternal Grandmother    Diabetes Paternal Grandmother    Hypertension Paternal Grandmother    Hypertension Paternal Grandfather    Liver disease Neg Hx    Esophageal cancer Neg Hx    Colon cancer Neg Hx    Allergies as of 04/01/2024       Reactions   Penicillins Hives        Medication List        Accurate as of April 01, 2024 11:05 AM.  If you have any questions, ask your nurse or doctor.          STOP taking these medications    doxycycline  100 MG tablet Commonly known as: VIBRA -TABS Stopped by: Charlies Bellini, DO       TAKE these medications    cetirizine  10 MG tablet Commonly known as: ZYRTEC  Take 1 tablet (10 mg total) by mouth at bedtime.   cyclobenzaprine  5 MG tablet Commonly known as: FLEXERIL  Take 1 tablet (5 mg total) by mouth 3 (three) times daily as needed for muscle spasms. Started by: Danelly Hassinger, DO   fluticasone  50 MCG/ACT nasal spray Commonly known as: FLONASE  SPRAY 2 SPRAYS INTO EACH NOSTRIL EVERY DAY   methylPREDNISolone  4 MG Tbpk tablet Commonly known as: MEDROL  DOSEPAK Per dose pack instructions Started by: Charlies Bellini, DO   montelukast  10 MG tablet Commonly known as: SINGULAIR  Take 1  tablet (10 mg total) by mouth at bedtime.   multivitamin capsule Take 1 capsule by mouth daily.   RABEprazole  20 MG tablet Commonly known as: ACIPHEX  Take 1 tablet (20 mg total) by mouth 2 (two) times daily at 8 am and 10 pm.   vitamin B-12 100 MCG tablet Commonly known as: CYANOCOBALAMIN  Take 100 mcg by mouth daily.   Vitamin D3 50 MCG (2000 UT) Tabs Take 1 tablet by mouth daily.        All past medical history, surgical history, allergies, family history, immunizations andmedications were updated in the EMR today and reviewed under the history and medication portions of their EMR.     Review of Systems  Constitutional:  Negative for chills, diaphoresis, fever and malaise/fatigue.  HENT: Negative.    Eyes: Negative.   Respiratory: Negative.    Cardiovascular: Negative.   Gastrointestinal: Negative.   Genitourinary: Negative.   Musculoskeletal: Negative.   Skin:  Negative for itching and rash.  Neurological:  Positive for headaches. Negative for dizziness, sensory change, speech change, focal weakness and weakness.   Negative, with the exception of above mentioned in HPI   Objective:  BP 120/80   Pulse 71   Temp 97.9 F (36.6 C)   Ht 5' 2.5 (1.588 m)   Wt 142 lb (64.4 kg)   LMP 03/04/2024   SpO2 98%   BMI 25.56 kg/m  Body mass index is 25.56 kg/m. Physical Exam Vitals and nursing note reviewed.  Constitutional:      General: She is not in acute distress.    Appearance: Normal appearance. She is normal weight. She is not ill-appearing or toxic-appearing.  HENT:     Head: Normocephalic and atraumatic. No abrasion, contusion, masses or laceration. Hair is normal.     Jaw: No trismus or tenderness.      Comments: Scalp tender to palpation to light touch left occiput to anterior parietal area.  No rash or skin changes noted.  No swelling, bruising or erythema. Eyes:     General: No scleral icterus.       Right eye: No discharge.        Left eye: No  discharge.     Extraocular Movements: Extraocular movements intact.     Conjunctiva/sclera: Conjunctivae normal.     Pupils: Pupils are equal, round, and reactive to light.  Skin:    Findings: No rash.  Neurological:     Mental Status: She is alert and oriented to person, place, and time. Mental status is at baseline.     Motor: No weakness.  Coordination: Coordination normal.     Gait: Gait normal.  Psychiatric:        Mood and Affect: Mood normal.        Behavior: Behavior normal.        Thought Content: Thought content normal.        Judgment: Judgment normal.      No results found. No results found. No results found for this or any previous visit (from the past 24 hours).  Assessment/Plan: Aliea Bobe is a 43 y.o. female present for OV for  Occipital neuralgia of left side (Primary) Headache is separate issue from usual chronic sinusitis.  Patient's symptoms are consistent with tension headache versus occipital neuralgia.  She is tender on exam to light touch without any skin changes.  Present. Diagnosis was discussed in detail. Patient provided with Depo-Medrol  injection and start a steroid pack tomorrow. Start Flexeril  nightly for 7 days, and if not driving he can take Flexeril  every 12 hours. - methylPREDNISolone  acetate (DEPO-MEDROL ) injection 80 mg  Reviewed expectations re: course of current medical issues. Discussed self-management of symptoms. Outlined signs and symptoms indicating need for more acute intervention. Patient verbalized understanding and all questions were answered. Patient received an After-Visit Summary.  Return in about 2 weeks (around 04/15/2024).   No orders of the defined types were placed in this encounter.  Meds ordered this encounter  Medications   cyclobenzaprine  (FLEXERIL ) 5 MG tablet    Sig: Take 1 tablet (5 mg total) by mouth 3 (three) times daily as needed for muscle spasms.    Dispense:  90 tablet    Refill:  1    methylPREDNISolone  (MEDROL  DOSEPAK) 4 MG TBPK tablet    Sig: Per dose pack instructions    Dispense:  21 each    Refill:  0   methylPREDNISolone  acetate (DEPO-MEDROL ) injection 80 mg   Referral Orders  No referral(s) requested today     Note is dictated utilizing voice recognition software. Although note has been proof read prior to signing, occasional typographical errors still can be missed. If any questions arise, please do not hesitate to call for verification.   electronically signed by:  Charlies Bellini, DO  [facility] [place]       [1]  Allergies Allergen Reactions   Penicillins Hives   "

## 2024-04-01 NOTE — Patient Instructions (Addendum)

## 2024-04-21 ENCOUNTER — Ambulatory Visit: Payer: Self-pay

## 2024-04-21 NOTE — Telephone Encounter (Signed)
 FYI Only or Action Required?: FYI only for provider: appointment scheduled on 04/22/24.  Patient was last seen in primary care on 04/01/2024 by Catherine Fuller A, DO.  Called Nurse Triage reporting Headache.  Symptoms began several weeks ago.  Interventions attempted: OTC medications: Ibuprofen.  Symptoms are: gradually worsening.  Triage Disposition: See PCP When Office is Open (Within 3 Days)  Patient/caregiver understands and will follow disposition?: Yes      Copied from CRM #[id number]. Topic: Clinical - Red Word Triage >> Apr 21, 2024  9:05 AM Richerd A wrote: [facility] that prompted transfer to Nurse Triage: Patient is having pain in the back of her head-a muscle spasm she was told when she came to appointment in December Reason for Disposition  [1] MILD-MODERATE headache AND [2] present > 3 days (72 hours) AND [3] no improvement after using Care Advice  Answer Assessment - Initial Assessment Questions Patient called in to triage with complaints of back of head pain. This has been ongoing since 04/01/24 The patient stated she was seen in office on 12/19 and diagnosed with Occipital neuralgia of left side.  For home care, the patient is taking ibuprofen.   Appointment scheduled for further evaluation; Patient agrees with the plan of care, and will reach out if symptoms worsen or persist.       1. LOCATION: Where does it hurt?      Back of head   2. ONSET: When did the headache start? (e.g., minutes, hours, days)      Ongoing since 04/01/24   3. PATTERN: Does the pain come and go, or has it been constant since it started?     Constant   4. SEVERITY: How bad is the pain? and What does it keep you from doing?  (e.g., Scale 1-10; mild, moderate, or severe)    Moderate    5. RECURRENT SYMPTOM: Have you ever had headaches before? If Yes, ask: When was the last time? and What happened that time?      Yes   6. CAUSE: What do you think is causing the  headache?     Occipital neuralgia of left side  7. MIGRAINE: Have you been diagnosed with migraine headaches? If Yes, ask: Is this headache similar?      No   8. HEAD INJURY: Has there been any recent injury to your head?      No   9. OTHER SYMPTOMS: Do you have any other symptoms? (e.g., fever, stiff neck, eye pain, sore throat, cold symptoms)     No  Protocols used: Headache-A-AH

## 2024-04-21 NOTE — Telephone Encounter (Signed)
 No further action needed at this time. Pt has appt.

## 2024-04-22 ENCOUNTER — Ambulatory Visit: Admitting: Family Medicine

## 2024-04-22 NOTE — Telephone Encounter (Signed)
 Patient received call from office- need to reschedule today's appointment- she has scheduled for Monday with alternative provider.

## 2024-04-25 ENCOUNTER — Encounter: Payer: Self-pay | Admitting: Family Medicine

## 2024-04-25 ENCOUNTER — Ambulatory Visit (INDEPENDENT_AMBULATORY_CARE_PROVIDER_SITE_OTHER): Admitting: Family Medicine

## 2024-04-25 VITALS — BP 123/73 | HR 74 | Temp 98.0°F | Wt 145.2 lb

## 2024-04-25 DIAGNOSIS — M5481 Occipital neuralgia: Secondary | ICD-10-CM

## 2024-04-25 DIAGNOSIS — Z23 Encounter for immunization: Secondary | ICD-10-CM

## 2024-04-25 NOTE — Progress Notes (Signed)
 OFFICE VISIT  04/25/2024  CC:  Chief Complaint  Patient presents with   Headache    Last episode this weekend; left side; 01/05 started working; saw RK on 12/19    Patient is a 44 y.o. female who presents for headache.  HPI: She scribes about a 6-week history of waxing and waning intensity pain behind the left ear.  It spreads around the left occipital and parietal areas and sometimes down over the left posterior cervical soft tissues and into the actual ear itself. Feels like burning/sharp pain.  No rash.  When she went on vacation and was under less stress and was able to apply heat the problem nearly went away. She returned to work about a week ago and a couple of days after that the pain returned significantly.  She just got through a weekend of rest so today it does not hurt that bad at all.  Of note , Dr. Catherine saw her 04/01/24, dx occipital neuralgia vs tension-->steroid inj + prednisone  and flexeril  rx's.  Review of systems: No dizziness, no visual or hearing abnormalities, no fever, no pain radiating over the shoulder or down the arm.  No arm paresthesias or weakness.  Past Medical History:  Diagnosis Date   Chicken pox    GERD (gastroesophageal reflux disease)    Gestational diabetes    Infertility, female    Laryngopharyngeal reflux    Recurrent sinusitis    Thyroid  disease    Vegetarian diet     Past Surgical History:  Procedure Laterality Date   7 HOUR PH STUDY N/A 12/30/2023   Procedure: MONITORING, ESOPHAGEAL PH, 24 HOUR;  Surgeon: Shila Gustav GAILS, MD;  Location: [facility];  Service: Gastroenterology;  Laterality: N/A;   ESOPHAGEAL MANOMETRY N/A 12/30/2023   Procedure: MANOMETRY, ESOPHAGUS;  Surgeon: Shila Gustav GAILS, MD;  Location: [facility];  Service: Gastroenterology;  Laterality: N/A;   WISDOM TOOTH EXTRACTION      Outpatient Medications Prior to Visit  Medication Sig Dispense Refill   cetirizine  (ZYRTEC ) 10 MG tablet Take 1 tablet (10 mg  total) by mouth at bedtime. 90 tablet 3   Cholecalciferol (VITAMIN D3) 50 MCG (2000 UT) TABS Take 1 tablet by mouth daily.     fluticasone  (FLONASE ) 50 MCG/ACT nasal spray SPRAY 2 SPRAYS INTO EACH NOSTRIL EVERY DAY 16 g 2   Multiple Vitamin (MULTIVITAMIN) capsule Take 1 capsule by mouth daily.     vitamin B-12 (CYANOCOBALAMIN ) 100 MCG tablet Take 100 mcg by mouth daily.     cyclobenzaprine  (FLEXERIL ) 5 MG tablet Take 1 tablet (5 mg total) by mouth 3 (three) times daily as needed for muscle spasms. (Patient not taking: Reported on 04/25/2024) 90 tablet 1   methylPREDNISolone  (MEDROL  DOSEPAK) 4 MG TBPK tablet Per dose pack instructions (Patient not taking: Reported on 04/25/2024) 21 each 0   montelukast  (SINGULAIR ) 10 MG tablet Take 1 tablet (10 mg total) by mouth at bedtime. (Patient not taking: Reported on 04/25/2024) 90 tablet 3   RABEprazole  (ACIPHEX ) 20 MG tablet Take 1 tablet (20 mg total) by mouth 2 (two) times daily at 8 am and 10 pm. (Patient not taking: Reported on 04/25/2024) 180 tablet 3   No facility-administered medications prior to visit.    Allergies[1]  Review of Systems  As per HPI  PE:    04/25/2024   10:07 AM 04/01/2024    8:23 AM 03/08/2024    7:47 AM  Vitals with BMI  Height  5' 2.5   Weight  145 lbs 3 oz 142 lbs 143 lbs 10 oz  BMI 26.12 25.54   Systolic 123 120 881  Diastolic 73 80 78  Pulse 74 71 75     Physical Exam  Gen: Alert, well appearing.  Patient is oriented to person, place, time, and situation. AFFECT: pleasant, lucid thought and speech. She has focal tenderness to palpation over the left occipital region.  No rash. Range of motion of the neck is fully intact but full flexion does cause a little bit of flare of the pain in the occipital region.   LABS:  Last CBC Lab Results  Component Value Date   WBC 7.1 03/08/2024   HGB 13.7 03/08/2024   HCT 39.8 03/08/2024   MCV 84.8 03/08/2024   MCH 29.3 05/14/2022   RDW 13.3 03/08/2024   PLT 236.0  03/08/2024   Last metabolic panel Lab Results  Component Value Date   GLUCOSE 90 03/08/2024   NA 137 03/08/2024   K 4.9 03/08/2024   CL 106 03/08/2024   CO2 26 03/08/2024   BUN 7 03/08/2024   CREATININE 0.70 03/08/2024   GFR 105.36 03/08/2024   CALCIUM 10.2 03/08/2024   PROT 7.3 03/08/2024   ALBUMIN 4.5 03/08/2024   BILITOT 0.4 03/08/2024   ALKPHOS 48 03/08/2024   AST 24 03/08/2024   ALT 25 03/08/2024   Last lipids Lab Results  Component Value Date   CHOL 232 (H) 05/02/2022   HDL 52.90 05/02/2022   LDLDIRECT 141.0 05/02/2022   TRIG 278.0 (H) 05/02/2022   CHOLHDL 4 05/02/2022   Last hemoglobin A1c Lab Results  Component Value Date   HGBA1C 5.4 05/02/2022   Last thyroid  functions Lab Results  Component Value Date   TSH 2.72 03/08/2024   FREET4 1.07 05/01/2021   THYROIDAB 2 12/15/2018   Last vitamin D  Lab Results  Component Value Date   VD25OH 46.99 05/02/2022   Last vitamin B12 and Folate Lab Results  Component Value Date   VITAMINB12 273 05/02/2022   IMPRESSION AND PLAN:  Occipital neuralgia, left side. Systemic steroids and rest/medication seemed to help.  However, symptoms never completely abated, then they reflared significantly when she went back to work.  Today not so bad. Discussed steroid injection around the left greater occipital nerve as the next step.  She we will consider this and if the pain returns today and lasts the next couple of days she will call for appointment for injection.  An After Visit Summary was printed and given to the patient.  FOLLOW UP: Return for Follow-up in 3 to 4 days if not improving.  Signed:  Gerlene Hockey, MD           04/25/2024      [1]  Allergies Allergen Reactions   Penicillins Hives
